# Patient Record
Sex: Female | Born: 1980 | Race: White | Hispanic: No | Marital: Married | Smoking: Current every day smoker
Health system: Southern US, Community
[De-identification: ages and names within clinical notes are randomized; demographics above are authoritative.]

## PROBLEM LIST (undated history)

## (undated) ENCOUNTER — Inpatient Hospital Stay (HOSPITAL_COMMUNITY): Payer: Self-pay

## (undated) DIAGNOSIS — G43909 Migraine, unspecified, not intractable, without status migrainosus: Secondary | ICD-10-CM

## (undated) DIAGNOSIS — R569 Unspecified convulsions: Secondary | ICD-10-CM

## (undated) DIAGNOSIS — N200 Calculus of kidney: Secondary | ICD-10-CM

## (undated) DIAGNOSIS — M549 Dorsalgia, unspecified: Secondary | ICD-10-CM

## (undated) DIAGNOSIS — D649 Anemia, unspecified: Secondary | ICD-10-CM

## (undated) DIAGNOSIS — O24419 Gestational diabetes mellitus in pregnancy, unspecified control: Secondary | ICD-10-CM

## (undated) DIAGNOSIS — O139 Gestational [pregnancy-induced] hypertension without significant proteinuria, unspecified trimester: Secondary | ICD-10-CM

## (undated) DIAGNOSIS — F419 Anxiety disorder, unspecified: Secondary | ICD-10-CM

## (undated) DIAGNOSIS — N83209 Unspecified ovarian cyst, unspecified side: Secondary | ICD-10-CM

## (undated) HISTORY — PX: OVARIAN CYST REMOVAL: SHX89

---
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
## 2002-05-25 ENCOUNTER — Encounter: Payer: Self-pay | Admitting: Obstetrics & Gynecology

## 2002-05-25 ENCOUNTER — Ambulatory Visit (HOSPITAL_COMMUNITY): Admission: RE | Admit: 2002-05-25 | Discharge: 2002-05-25 | Payer: Self-pay | Admitting: Obstetrics & Gynecology

## 2003-01-04 ENCOUNTER — Emergency Department (HOSPITAL_COMMUNITY): Admission: EM | Admit: 2003-01-04 | Discharge: 2003-01-04 | Payer: Self-pay | Admitting: Emergency Medicine

## 2003-01-04 ENCOUNTER — Encounter: Payer: Self-pay | Admitting: Emergency Medicine

## 2003-01-08 ENCOUNTER — Emergency Department (HOSPITAL_COMMUNITY): Admission: EM | Admit: 2003-01-08 | Discharge: 2003-01-09 | Payer: Self-pay | Admitting: Internal Medicine

## 2003-01-08 ENCOUNTER — Encounter: Payer: Self-pay | Admitting: *Deleted

## 2004-08-30 ENCOUNTER — Other Ambulatory Visit: Admission: RE | Admit: 2004-08-30 | Discharge: 2004-08-30 | Payer: Self-pay | Admitting: Family Medicine

## 2005-03-10 ENCOUNTER — Ambulatory Visit (HOSPITAL_COMMUNITY): Admission: RE | Admit: 2005-03-10 | Discharge: 2005-03-10 | Payer: Self-pay | Admitting: Family Medicine

## 2005-06-08 ENCOUNTER — Emergency Department (HOSPITAL_COMMUNITY): Admission: EM | Admit: 2005-06-08 | Discharge: 2005-06-09 | Payer: Self-pay | Admitting: Emergency Medicine

## 2005-09-23 ENCOUNTER — Other Ambulatory Visit: Admission: RE | Admit: 2005-09-23 | Discharge: 2005-09-23 | Payer: Self-pay | Admitting: Family Medicine

## 2005-10-14 ENCOUNTER — Emergency Department (HOSPITAL_COMMUNITY): Admission: EM | Admit: 2005-10-14 | Discharge: 2005-10-15 | Payer: Self-pay | Admitting: Emergency Medicine

## 2005-10-16 ENCOUNTER — Ambulatory Visit (HOSPITAL_COMMUNITY): Admission: RE | Admit: 2005-10-16 | Discharge: 2005-10-16 | Payer: Self-pay | Admitting: Emergency Medicine

## 2005-12-06 ENCOUNTER — Emergency Department (HOSPITAL_COMMUNITY): Admission: EM | Admit: 2005-12-06 | Discharge: 2005-12-06 | Payer: Self-pay | Admitting: Emergency Medicine

## 2006-08-07 ENCOUNTER — Ambulatory Visit (HOSPITAL_COMMUNITY): Admission: RE | Admit: 2006-08-07 | Discharge: 2006-08-07 | Payer: Self-pay | Admitting: Family Medicine

## 2006-08-29 ENCOUNTER — Emergency Department (HOSPITAL_COMMUNITY): Admission: EM | Admit: 2006-08-29 | Discharge: 2006-08-29 | Payer: Self-pay | Admitting: Emergency Medicine

## 2006-09-10 ENCOUNTER — Ambulatory Visit (HOSPITAL_COMMUNITY): Admission: RE | Admit: 2006-09-10 | Discharge: 2006-09-10 | Payer: Self-pay | Admitting: Orthopedic Surgery

## 2006-11-07 ENCOUNTER — Emergency Department (HOSPITAL_COMMUNITY): Admission: EM | Admit: 2006-11-07 | Discharge: 2006-11-07 | Payer: Self-pay | Admitting: Emergency Medicine

## 2006-11-14 ENCOUNTER — Emergency Department (HOSPITAL_COMMUNITY): Admission: EM | Admit: 2006-11-14 | Discharge: 2006-11-14 | Payer: Self-pay | Admitting: Emergency Medicine

## 2007-01-22 ENCOUNTER — Emergency Department (HOSPITAL_COMMUNITY): Admission: EM | Admit: 2007-01-22 | Discharge: 2007-01-23 | Payer: Self-pay | Admitting: Emergency Medicine

## 2007-01-23 ENCOUNTER — Ambulatory Visit (HOSPITAL_COMMUNITY): Admission: RE | Admit: 2007-01-23 | Discharge: 2007-01-23 | Payer: Self-pay | Admitting: Obstetrics and Gynecology

## 2007-06-07 ENCOUNTER — Observation Stay (HOSPITAL_COMMUNITY): Admission: AD | Admit: 2007-06-07 | Discharge: 2007-06-07 | Payer: Self-pay | Admitting: Obstetrics & Gynecology

## 2007-07-08 ENCOUNTER — Inpatient Hospital Stay (HOSPITAL_COMMUNITY): Admission: AD | Admit: 2007-07-08 | Discharge: 2007-07-10 | Payer: Self-pay | Admitting: Obstetrics & Gynecology

## 2007-07-08 ENCOUNTER — Ambulatory Visit: Payer: Self-pay | Admitting: Obstetrics & Gynecology

## 2007-12-07 IMAGING — US US TRANSVAGINAL NON-OB
1 series · 14 of 25 positions shown · non-contrast
Comparison: 10/06/05.

CLINICAL DATA: Pelvic pain and history of ovarian cysts.  
 TRANSABDOMINAL AND TRANSVAGINAL PELVIC ULTRASOUND:
TECHNIQUE: Both transabdominal and transvaginal ultrasound examinations of the pelvis were performed including evaluation of the uterus, ovaries, adnexal regions, and pelvic cul-de-sac.

[Series 1: unknown · 0.32mm/px · 14 of 52 slices shown]
[im 1/52]
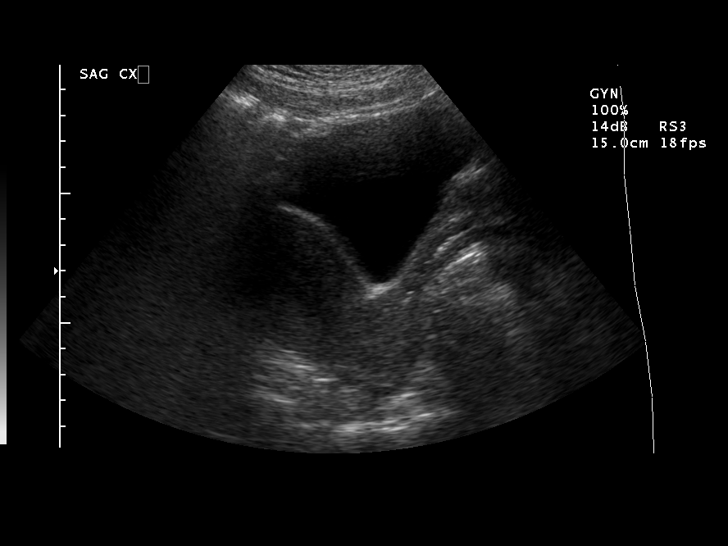
[im 5/52]
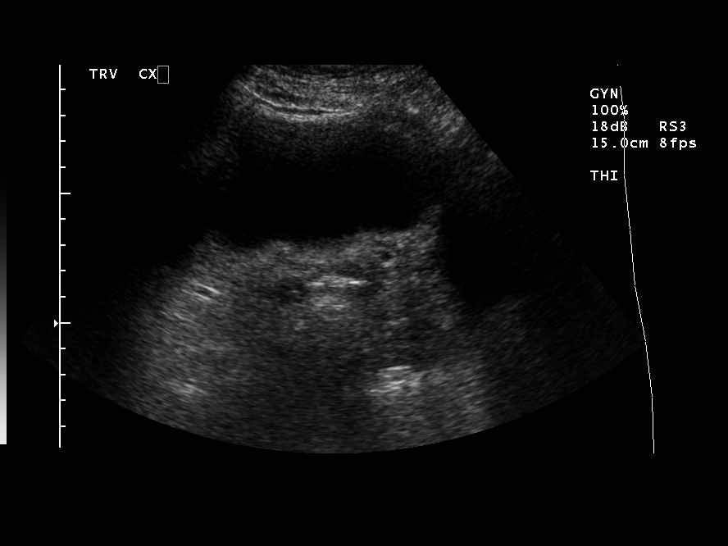
[im 9/52]
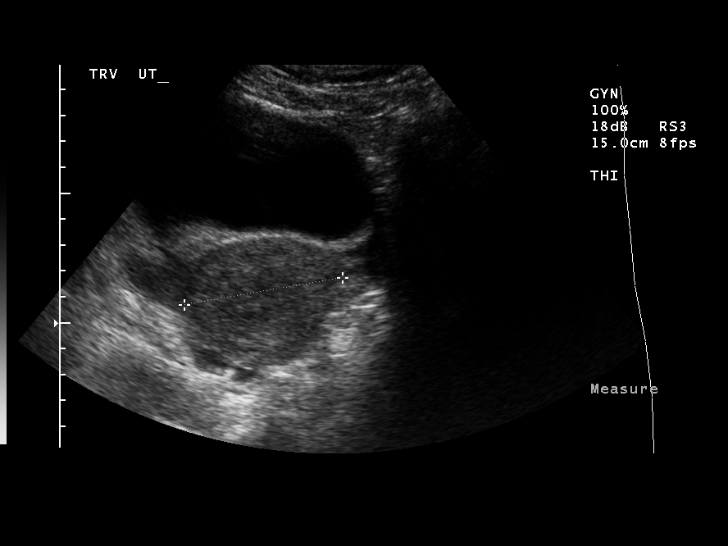
[im 13/52]
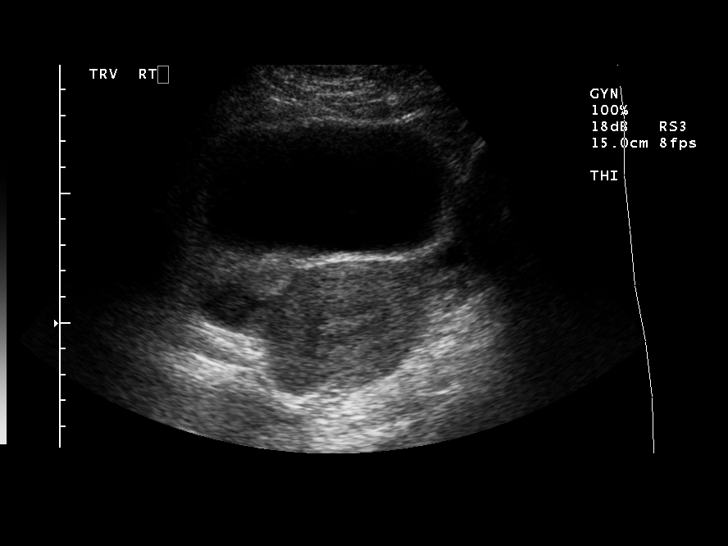
[im 18/52]
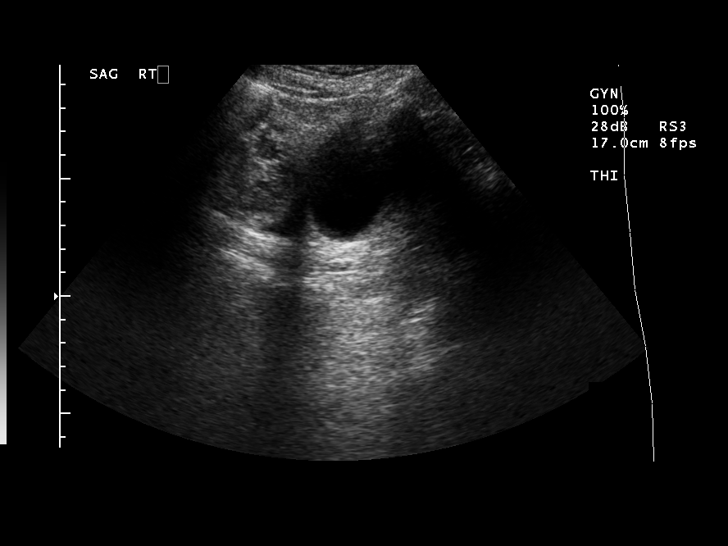
[im 20/52]
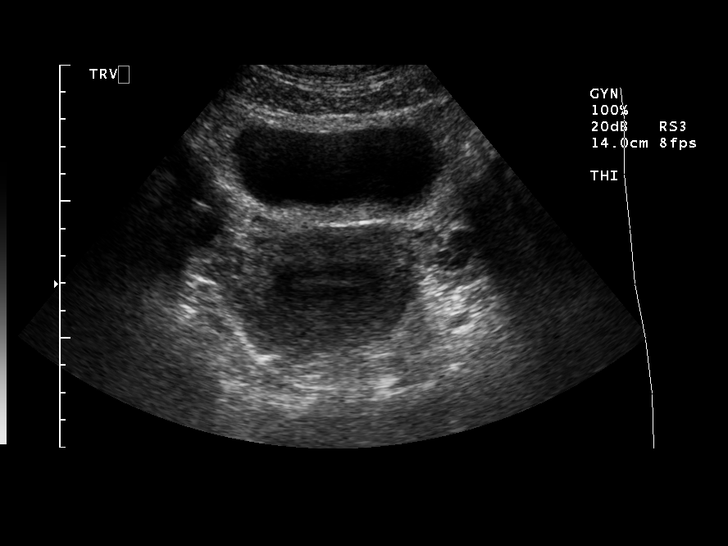
[im 24/52]
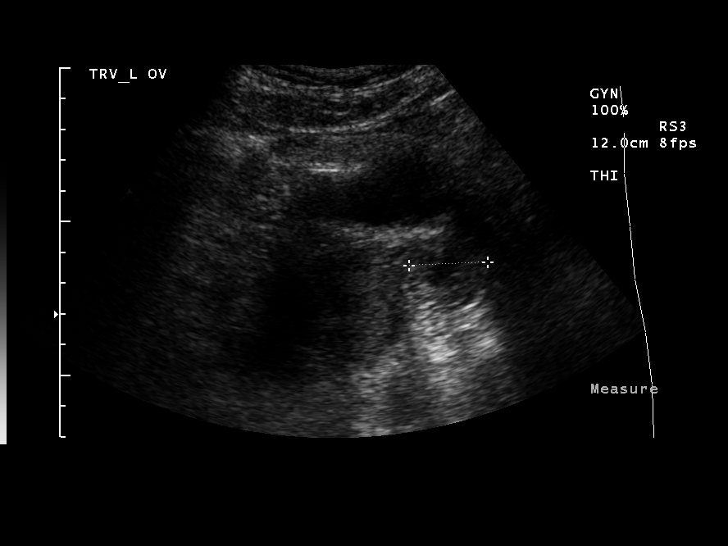
[im 28/52]
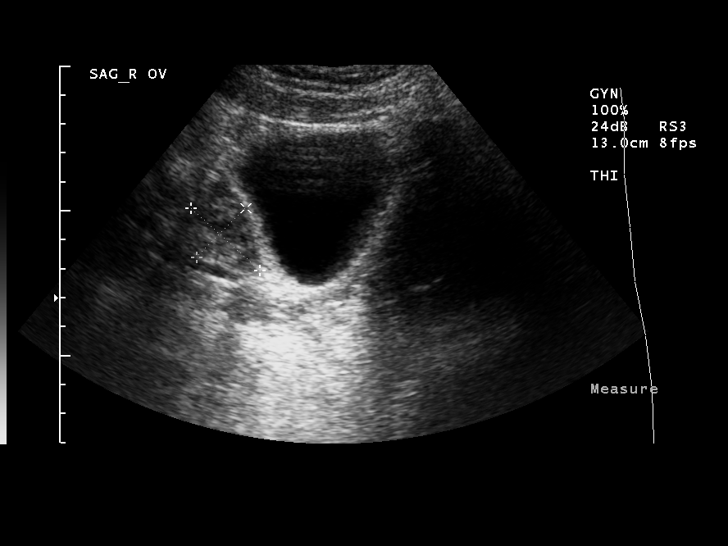
[im 32/52]
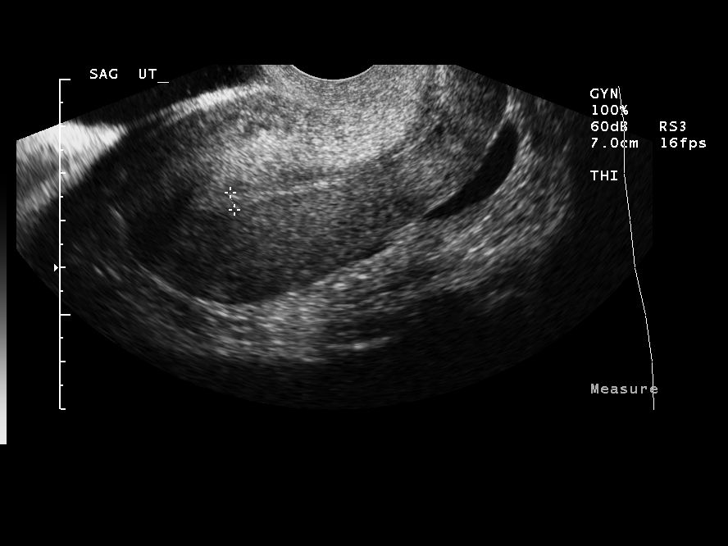
[im 35/52]
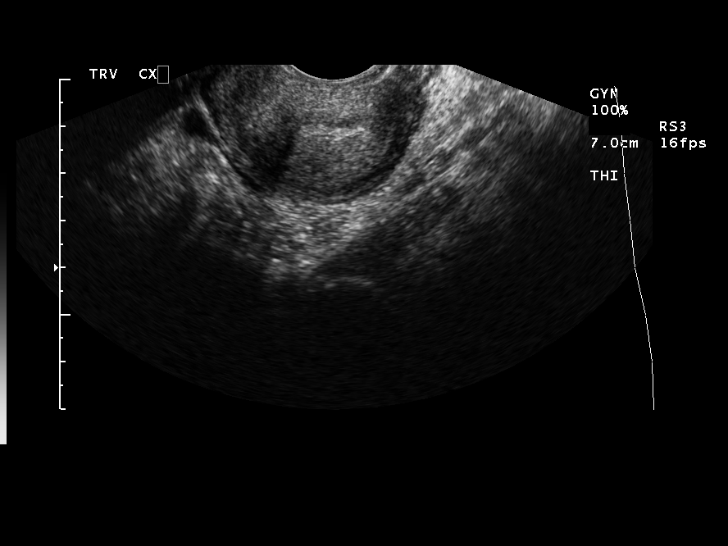
[im 39/52]
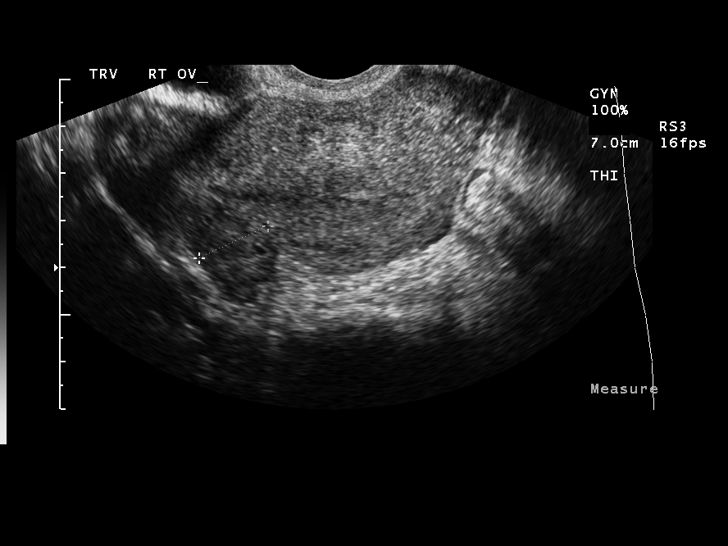
[im 43/52]
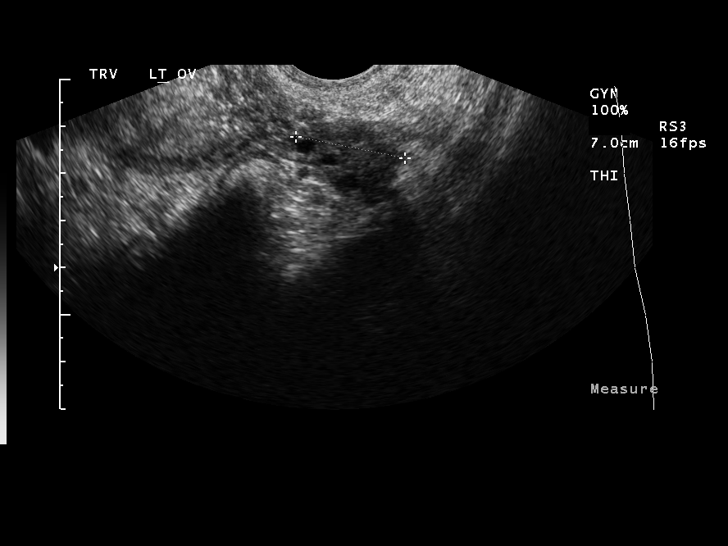
[im 47/52]
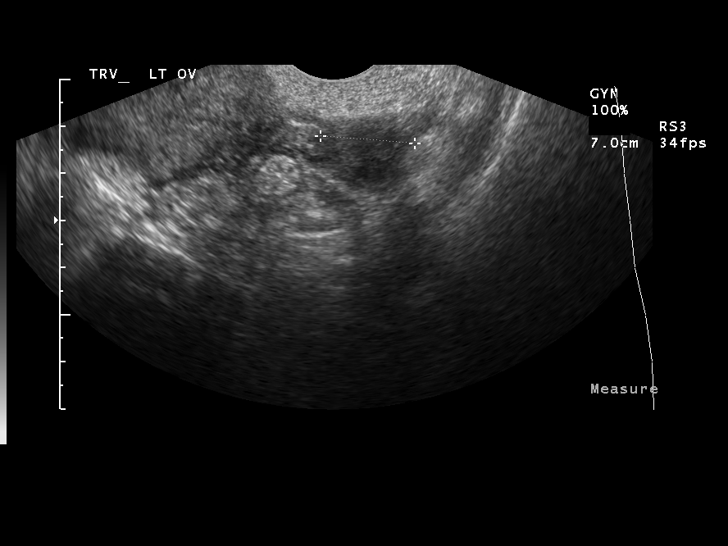
[im 52/52]
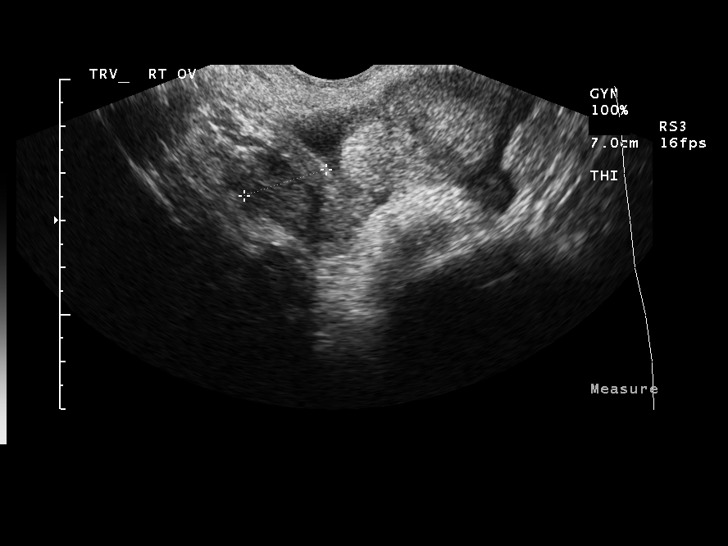

[14 of 25 positions shown; findings below may reference images not displayed]

FINDINGS: Both transabdominal and transvaginal studies were performed.  Uterine dimensions are approximately 7.2 x 4.4 x 6.2 cm.  The endometrium is of normal thickness measuring 4 mm.  Uterus has a normal appearance. 
 The right ovary is normal and measures 2.4 x 1.9 x 1.8 cm.  The left ovary measures 2.3 x 1.4 x 2.5 cm.  There is a focal cyst present in the left ovary measuring 1.7 cm in greatest diameter.  By transvaginal study, this cyst is internally complex and likely consistent with a hemorrhagic cyst.  A small amount of free fluid is present in the pelvis.
IMPRESSION: Small and likely hemorrhagic cyst measuring 1.7 cm in greatest diameter within the left ovary.  Small amount of pelvic fluid.

## 2008-03-18 ENCOUNTER — Emergency Department (HOSPITAL_COMMUNITY): Admission: EM | Admit: 2008-03-18 | Discharge: 2008-03-18 | Payer: Self-pay | Admitting: Emergency Medicine

## 2008-12-02 ENCOUNTER — Emergency Department (HOSPITAL_COMMUNITY): Admission: EM | Admit: 2008-12-02 | Discharge: 2008-12-02 | Payer: Self-pay | Admitting: Emergency Medicine

## 2009-01-24 ENCOUNTER — Encounter: Admission: RE | Admit: 2009-01-24 | Discharge: 2009-01-24 | Payer: Self-pay | Admitting: Neurology

## 2009-02-05 ENCOUNTER — Encounter: Admission: RE | Admit: 2009-02-05 | Discharge: 2009-04-04 | Payer: Self-pay | Admitting: Family Medicine

## 2009-03-30 ENCOUNTER — Encounter: Admission: RE | Admit: 2009-03-30 | Discharge: 2009-03-30 | Payer: Self-pay | Admitting: Neurosurgery

## 2009-05-17 ENCOUNTER — Encounter: Admission: RE | Admit: 2009-05-17 | Discharge: 2009-06-05 | Payer: Self-pay | Admitting: Anesthesiology

## 2009-05-22 ENCOUNTER — Ambulatory Visit: Payer: Self-pay | Admitting: Anesthesiology

## 2009-06-05 ENCOUNTER — Ambulatory Visit: Payer: Self-pay | Admitting: Anesthesiology

## 2009-10-12 ENCOUNTER — Ambulatory Visit: Payer: Self-pay | Admitting: Diagnostic Radiology

## 2009-10-12 ENCOUNTER — Emergency Department (HOSPITAL_BASED_OUTPATIENT_CLINIC_OR_DEPARTMENT_OTHER): Admission: EM | Admit: 2009-10-12 | Discharge: 2009-10-12 | Payer: Self-pay | Admitting: Emergency Medicine

## 2009-12-04 ENCOUNTER — Ambulatory Visit: Payer: Self-pay | Admitting: Diagnostic Radiology

## 2009-12-04 ENCOUNTER — Emergency Department (HOSPITAL_BASED_OUTPATIENT_CLINIC_OR_DEPARTMENT_OTHER): Admission: EM | Admit: 2009-12-04 | Discharge: 2009-12-04 | Payer: Self-pay | Admitting: Emergency Medicine

## 2009-12-24 ENCOUNTER — Emergency Department (HOSPITAL_COMMUNITY): Admission: EM | Admit: 2009-12-24 | Discharge: 2009-12-24 | Payer: Self-pay | Admitting: Emergency Medicine

## 2011-03-04 ENCOUNTER — Emergency Department (HOSPITAL_COMMUNITY): Payer: No Typology Code available for payment source

## 2011-03-04 ENCOUNTER — Emergency Department (HOSPITAL_COMMUNITY)
Admission: EM | Admit: 2011-03-04 | Discharge: 2011-03-04 | Disposition: A | Discharge status: Home / Self Care | Payer: No Typology Code available for payment source | Attending: General Surgery | Admitting: General Surgery

## 2011-03-04 DIAGNOSIS — M542 Cervicalgia: Secondary | ICD-10-CM | POA: Insufficient documentation

## 2011-03-04 DIAGNOSIS — M545 Low back pain, unspecified: Secondary | ICD-10-CM | POA: Insufficient documentation

## 2011-03-04 DIAGNOSIS — R072 Precordial pain: Secondary | ICD-10-CM | POA: Insufficient documentation

## 2011-03-09 LAB — URINALYSIS, ROUTINE W REFLEX MICROSCOPIC: pH: 6.5 (ref 5.0–8.0)

## 2011-03-09 LAB — URINE MICROSCOPIC-ADD ON

## 2011-03-09 LAB — DIFFERENTIAL
Eosinophils Relative: 2 % (ref 0–5)
Lymphocytes Relative: 44 % (ref 12–46)
Lymphs Abs: 4 10*3/uL (ref 0.7–4.0)
Monocytes Absolute: 0.7 10*3/uL (ref 0.1–1.0)
Monocytes Relative: 8 % (ref 3–12)

## 2011-03-09 LAB — CBC
HCT: 40.7 % (ref 36.0–46.0)
MCV: 93.3 fL (ref 78.0–100.0)
RBC: 4.36 MIL/uL (ref 3.87–5.11)

## 2011-03-09 LAB — BASIC METABOLIC PANEL
BUN: 12 mg/dL (ref 6–23)
Calcium: 9.5 mg/dL (ref 8.4–10.5)
Creatinine, Ser: 0.63 mg/dL (ref 0.4–1.2)
Potassium: 3.5 mEq/L (ref 3.5–5.1)
Sodium: 139 mEq/L (ref 135–145)

## 2011-03-09 LAB — URINE CULTURE: Culture: NO GROWTH

## 2011-03-19 ENCOUNTER — Other Ambulatory Visit (HOSPITAL_COMMUNITY): Payer: Self-pay | Admitting: Sports Medicine

## 2011-03-19 DIAGNOSIS — R112 Nausea with vomiting, unspecified: Secondary | ICD-10-CM

## 2011-03-21 ENCOUNTER — Other Ambulatory Visit (HOSPITAL_COMMUNITY): Payer: Self-pay | Admitting: Sports Medicine

## 2011-03-21 ENCOUNTER — Ambulatory Visit (HOSPITAL_COMMUNITY)
Admission: RE | Admit: 2011-03-21 | Discharge: 2011-03-21 | Disposition: A | Discharge status: Home / Self Care | Payer: No Typology Code available for payment source | Source: Ambulatory Visit | Attending: Sports Medicine | Admitting: Sports Medicine

## 2011-03-21 DIAGNOSIS — R112 Nausea with vomiting, unspecified: Secondary | ICD-10-CM

## 2011-03-21 DIAGNOSIS — R1032 Left lower quadrant pain: Secondary | ICD-10-CM | POA: Insufficient documentation

## 2011-03-21 MED ORDER — IOHEXOL 300 MG/ML  SOLN
80.0000 mL | Freq: Once | INTRAMUSCULAR | Status: AC | PRN
  Administered 2011-03-21: 80 mL via INTRAVENOUS

## 2011-03-25 LAB — PREGNANCY, URINE: Preg Test, Ur: NEGATIVE

## 2011-03-27 LAB — URINALYSIS, ROUTINE W REFLEX MICROSCOPIC
Glucose, UA: NEGATIVE mg/dL
Ketones, ur: NEGATIVE mg/dL
Protein, ur: NEGATIVE mg/dL

## 2011-03-27 LAB — URINE MICROSCOPIC-ADD ON

## 2011-05-06 NOTE — Procedures (Signed)
NAME:  Cassandra Roth, Cassandra Roth NO.:  [id number]   MEDICAL RECORD NO.:  [id number]           PATIENT TYPE:   LOCATION:                                 FACILITY:   PHYSICIAN:  Celene Kras, MD        DATE OF BIRTH:  1981/11/26   DATE OF PROCEDURE:  DATE OF DISCHARGE:                               OPERATIVE REPORT   [facility].  I evaluated  him.  I have reviewed the health and history form.  I reviewed the 47-  point review of systems.   1. Cassandra Roth comes to [place] today for scheduled transforaminal intervention,      left side; left side most problematic, some right-sided pain, but a      radicular element seem to be pronounced on left.  2. No interval change.  Modifiable patient's health profile reviewed.   I have used models and discussed in lay terms ample times and as  requested.   Objectively diffuse paralumbar myofascial discomfort, Fortin test  positive, side bending, straight leg lift impaired, nothing new  neurologically.   IMPRESSION:  She has spondylolysis of the lumbar spine, radicular  symptoms.   PLAN:  Transforaminal epidural intervention, left L5 with needle  technique under local anesthetic.  She is consented.   The patient  was taken to the fluoroscopy suite and was placed on prone  position.  The back prepped and draped in the usual fashion.  Using a 22-  gauge spinal needle, I advanced this up to the transforaminal epidural  space, confirmed placement.  No CSF, heme, or paresthesia.  Reconfirmation placement multiple position, contrast.  Once contrast  confirmation, withdrawn needle technique, 2 mL of lidocaine 1% MPF with  40 mg of Aristocort.   Tolerated procedure well.  No complication from our procedure.  Appropriate recovery.  Discharge instructions given.           ______________________________  Celene Kras, MD     HH/MEDQ  D:  06/05/2009 10:57:35  T:  06/06/2009 01:02:29  Job:  [id number]

## 2011-05-06 NOTE — Assessment & Plan Note (Signed)
Cassandra Roth comes to the pain management through Dr. Cassandria Santee office.  She is a 30 year old who at one time has been contemplated for surgical  correction of L5-S1 retrolisthesis, diskogenic pathology.  Complaining  of left radicular pain consistent with 4 and 5, she states she is taking  oxycodone q.6-8 h. but many times has to take q.4 h.  She is recently  started on diazepam.  She has no known drug allergies.  8/10 subjective  scale, functional impairment made worse by walking, bending, sitting,  and standing.  Improved with rest and medications.  She is working now  as a Agricultural engineer, and there is apparently some lifting involved  which aggravates her condition.  Assisted at home, she works 40 hours,  needs to keep continue to work as she has young children.  14-point  review of systems, PMH remarkable for MRI diskogenic pathology at 5-1,  she has had what sounds like a singular midline epidural with mixed  results.   She has past medical history of seizures unspecified.  She had  laparoscopic surgery, unspecified followed by primary care.  She is a  smoker and has been counseled married, denies chemical and alcohol  dependency.   FAMILY HISTORY:  Diabetes, hypertension.   Review of systems, family, and social history are otherwise  noncontributory for pain problems.   PHYSICAL EXAMINATION:  GENERAL:  Pleasant female sitting comfortably in  bed.  Gait, affect, and appearance is normal and oriented x3.  HEENT:  Otherwise unremarkable.  CHEST:  Clear to auscultation and percussion.  HEART:  She has regular rate and rhythm without rub, murmur, or gallop.  ABDOMEN:  Soft, nontender, benign.  No hepatosplenomegaly.  MUSCULOSKELETAL:  She has diffuse paralumbar myofascial discomfort.  Pain with extension and side bending and Fortin test equivocal.  Modified Gaenslen's and Patrick's equivocal.  Pain with extension and  side bending.  Straight leg lift unremarkable, she does have  some points  to the left, full extension, EHL intact as is neurological assessment.   IMPRESSION:  Degenerative spine disease, lumbar spine, radicular  symptoms.   PLAN:  1. Recommend transforaminal approach for dense application of drugs at      the most probable cytopathology L5.  Consider facet intervention      with RF as an option.  This may be one of our best nonsurgical      approaches in the young individual, vital member of society.  She      wants to continue work.  2. She has been told in order to help her with some insight here that      cigarette cessation is mandatory for best outcome.  3. We will do appropriate backgrounds and I am going to start her on      Neurontin, she has Percocet, consider adding other adjuncts.  The      Neurontin will be utilized at bedtime.  I have reviewed this      medication with her.   Discharge instructions given.  No barrier to communication.  We will see  her in followup.           ______________________________  Celene Kras, MD     HH/MedQ  D:  05/22/2009 12:27:06  T:  05/23/2009 02:52:18  Job #:  [id number]   cc:   Hilda Lias, M.D.  Fax: [phone]

## 2011-05-06 NOTE — H&P (Signed)
NAME:  Cassandra Roth, Cassandra Roth                  ACCOUNT NO.:  [id number]   MEDICAL RECORD NO.:  [id number]          PATIENT TYPE:  INP   LOCATION:  9175                          FACILITY:  [facility]   PHYSICIAN:  Tilda Burrow, M.D. DATE OF BIRTH:  01/19/1981   DATE OF ADMISSION:  07/08/2007  DATE OF DISCHARGE:                              HISTORY & PHYSICAL   REASON FOR ADMISSION:  Pregnancy at 38-39 weeks, in active labor.   [place] was admitted through [facility]U in active labor.  Patient was 8-9 cm,  completely effaced, and 0 station.  At the time, she was mentioning  complaints of severe back pain.  She progressed rapidly after epidural  anesthesia and delivered spontaneously.  From delivery of head, the  infant was thoroughly suctioned.  There was a short shoulder cord noted,  which was easily slipped over and reduced.  Spontaneous delivery of  infant without complications.  Apgars were 8 and 9.  The perineum was  inspected and noted to be intact.  Cord bloods were obtained.  The  placenta was delivered spontaneously via Schultze mechanism.  A three-  vessel cord was noted to be intact, noted on inspection, and membranes  are noted to be intact upon inspection.  __________ about 500 cc.   Infant and mother stabilized and transferred out to the postpartum unit  in stable condition.   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      Tilda Burrow, M.D.  Electronically Signed    DL/MEDQ  D:  04/54/0981  T:  07/08/2007  Job:  [id number]

## 2011-05-09 NOTE — Op Note (Signed)
[facility]  Patient:    Cassandra Roth, Cassandra Roth                       MRN: [id number] Proc. Date: 05/29/00 Adm. Date:  [id number] Disc. Date: [id number] Attending:  Donne Hazel                           Operative Report  PREOPERATIVE DIAGNOSES:       1. Pelvic pain.                               2. Rule out endometriosis.  POSTOPERATIVE DIAGNOSIS:      Normal pelvis.  OPERATION:                    Laparoscopy  SURGEON:                      R. Alan Mulder, M.D.  ASSISTANT:  ANESTHESIA:                   General endotracheal  ESTIMATED BLOOD LOSS:         Less than 20 cc  COMPLICATIONS:                None.  FINDINGS:                     Normal pelvis and abdomen. At the time of laparoscopy, the pelvis was thoroughly visualized and noted to be normal. There was a small cyst of the left ovary which was removed; however, this was normal for a patient this age.  The adnexae, cul-de-sac and uterus were very normal.  The appendix was visualized and noted to be normal. The bowel and upper abdomen visualized and also normal.  INDICATIONS:  DESCRIPTION OF PROCEDURE:     The patient was taken to the operating room where a general endotracheal anesthesia was instituted.  The patient was placed on the operating table in the dorsal lithotomy position. The abdomen, perineum and vagina was prepped and draped in the usual sterile fashion with Betadine and sterile drapes.  A red rubber catheter was used to empty the bladder.  A Hulka tenaculum was placed inside the uterus for uterine manipulation.  Next, a small vertical infraumbilical skin incision was made through which a Verres needle was inserted atraumatically into the abdominal cavity.  A pneumoperitoneum was created with 1 liters of carbon dioxide gas. The Verres needle was removed and a disposable laparoscopic trocar was inserted atraumatically into the abdominal cavity.  The laparoscope was  then inserted with the above noted findings.  The corpus luteum on the left side was drained using the bipolar cautery.   This was extremely normal for a patient this age as mentioned.  A second incision was made two fingerbreadths above the pubic symphysis and in the midline.  A 5 mm trocar was placed under direct, continuous laparoscopic guidance.  This was done prior to draining of the little cyst.  The pelvis, appendix, bowel and upper abdominal were visualized and all noted to be normal. The pelvis was then irrigated with irrigant. No excessive bleeding noted. Attention was then turned to closure. All abdominal instruments were removed and the gas allowed to escape fully from the abdomen.  The umbilical skin incision was closed first with 0  Vicryl on the muscle fascia times three interrupted sutures.  The skin was reapproximated with Dermabond on both incisions. All vaginal instruments were removed.  The patient was awakened, extubated and taken to the recovery room in good condition.  There were no preoperative complications. DD:  05/29/00 TD:  06/01/00 Job: [id number]

## 2011-05-09 NOTE — H&P (Signed)
[facility]  Patient:    Cassandra Roth, Cassandra Roth                       MRN: [id number] Adm. Date:  [id number] Attending:  Donne Hazel                         History and Physical  HISTORY OF PRESENT ILLNESS:       Ms. Cassandra Roth is a 30 year old female, gravida 1, para 1, admitted to laparoscopy due to primary pelvic pain.  The patient has been on oral contraception and has received nonsteroidal anti-inflammatory agents and mild narcotic agents that failed to improve her pain.  She continues to have worsening pelvic pain and requests laparoscopy.  She is otherwise healthy.  She is a smoker, however.  PAST MEDICAL HISTORY:             History of asthma.  OBSTETRICAL HISTORY:              Normal spontaneous vaginal delivery x 1 at term.  PAST SURGICAL HISTORY:            None.  CURRENT MEDICATIONS:              Ortho Tri-Cyclen.  ALLERGIES:                        None known.  SOCIAL HISTORY:                   Positive for one pack per day of cigarettes.  PHYSICAL EXAMINATION:  VITAL SIGNS:                      Stable, blood pressure 127/77, temperature 97.6, pulse 82, respirations 18.  GENERAL:                          Well-developed, well-nourished female in no acute distress.  HEENT:                            Within normal limits.  NECK:                             Supple without adenopathy or thyromegaly.  HEART:                            Regular rate and rhythm without murmur, gallop, or rub.  LUNGS:                            Clear to auscultation.  BREASTS:                          Exam done recently in the office was normal but is deferred upon admission.  ABDOMEN:                          Soft, benign, and unremarkable.  EXTREMITIES:                      Grossly normal.  NEUROLOGIC:  Grossly normal.  PELVIC:                           Normal external female genitalia, vagina and cervix clear.  The uterus is  small and mildly tender and mobile.  The adnexa is clear of mass and mildly tender bilaterally.  No focal pathology palpated.  LABORATORY DATA:                  Ultrasound recently has been normal.  ADMISSION DIAGNOSES:              1. Pelvic pain of unknown etiology.                                   2. Rule out endometriosis.  PLAN:                             Laparoscopy.  DISCUSSION:                       Risks and benefits of surgery explained to patient.  The risk of bleeding, infection, risk of injury to surrounding organs reiterated.  The patient was allowed to ask questions and wished to proceed. DD:  05/29/00 TD:  05/29/00 Job: [id number]

## 2011-08-05 ENCOUNTER — Encounter: Payer: Self-pay | Admitting: *Deleted

## 2011-08-05 ENCOUNTER — Emergency Department (INDEPENDENT_AMBULATORY_CARE_PROVIDER_SITE_OTHER): Payer: Self-pay

## 2011-08-05 ENCOUNTER — Emergency Department (HOSPITAL_BASED_OUTPATIENT_CLINIC_OR_DEPARTMENT_OTHER)
Admission: EM | Admit: 2011-08-05 | Discharge: 2011-08-05 | Disposition: A | Discharge status: Home / Self Care | Payer: Self-pay | Attending: Emergency Medicine | Admitting: Emergency Medicine

## 2011-08-05 DIAGNOSIS — R404 Transient alteration of awareness: Secondary | ICD-10-CM

## 2011-08-05 DIAGNOSIS — X58XXXA Exposure to other specified factors, initial encounter: Secondary | ICD-10-CM

## 2011-08-05 DIAGNOSIS — S1093XA Contusion of unspecified part of neck, initial encounter: Secondary | ICD-10-CM

## 2011-08-05 DIAGNOSIS — R1110 Vomiting, unspecified: Secondary | ICD-10-CM

## 2011-08-05 DIAGNOSIS — W010XXA Fall on same level from slipping, tripping and stumbling without subsequent striking against object, initial encounter: Secondary | ICD-10-CM | POA: Insufficient documentation

## 2011-08-05 DIAGNOSIS — S060XAA Concussion with loss of consciousness status unknown, initial encounter: Secondary | ICD-10-CM | POA: Insufficient documentation

## 2011-08-05 DIAGNOSIS — Y92009 Unspecified place in unspecified non-institutional (private) residence as the place of occurrence of the external cause: Secondary | ICD-10-CM | POA: Insufficient documentation

## 2011-08-05 DIAGNOSIS — S060X9A Concussion with loss of consciousness of unspecified duration, initial encounter: Secondary | ICD-10-CM

## 2011-08-05 HISTORY — DX: Anxiety disorder, unspecified: F41.9

## 2011-08-05 HISTORY — DX: Unspecified ovarian cyst, unspecified side: N83.209

## 2011-08-05 HISTORY — DX: Dorsalgia, unspecified: M54.9

## 2011-08-05 MED ORDER — MORPHINE SULFATE 2 MG/ML IJ SOLN
2.0000 mg | Freq: Once | INTRAMUSCULAR | Status: AC
  Administered 2011-08-05: 2 mg via INTRAVENOUS
  Filled 2011-08-05: qty 1

## 2011-08-05 MED ORDER — HYDROCODONE-ACETAMINOPHEN 5-500 MG PO TABS: 1.0000 | ORAL_TABLET | Freq: Four times a day (QID) | ORAL | Status: DC | PRN

## 2011-08-05 MED ORDER — ONDANSETRON HCL 4 MG/2ML IJ SOLN
4.0000 mg | Freq: Once | INTRAMUSCULAR | Status: AC
  Administered 2011-08-05: 4 mg via INTRAVENOUS
  Filled 2011-08-05: qty 2

## 2011-08-05 MED ORDER — IBUPROFEN 600 MG PO TABS: 600.0000 mg | ORAL_TABLET | Freq: Four times a day (QID) | ORAL | Status: AC | PRN

## 2011-08-05 MED ORDER — HYDROCODONE-ACETAMINOPHEN 5-500 MG PO TABS: 1.0000 | ORAL_TABLET | Freq: Four times a day (QID) | ORAL | Status: AC | PRN

## 2011-08-05 MED ORDER — SODIUM CHLORIDE 0.9 % IV BOLUS (SEPSIS)
1000.0000 mL | Freq: Once | INTRAVENOUS | Status: AC
  Administered 2011-08-05: 1000 mL via INTRAVENOUS

## 2011-08-05 NOTE — ED Notes (Signed)
MD at bedside. Pt assessed. Ice pack given, pt tolerated well.

## 2011-08-05 NOTE — ED Notes (Signed)
Pt fell hitting side of head on bath tub, unknown LOC. Pt has swelling to outer left eye area and left side of head, no bleeding noted.

## 2011-08-05 NOTE — ED Notes (Signed)
Pt slipped in floor and fell hitting head on side of tub. Swelling noted to left side of face and outer eye, unknown LOC.

## 2011-08-05 NOTE — ED Provider Notes (Signed)
History     CSN: [id number] Arrival date & time: 08/05/2011  4:58 AM  Chief Complaint  Patient presents with  . Head Injury   HPI Comments: 30yoF previously healthy pw BHT. Per patient she slipped on something wet in the bathroom and felt herself falling. Daughter heard her fall and rushed into the bathroom- saw pt unconscious on the floor ? approx one minute. Pt eventually awake and a little confused. Reports multiple episodes of NBNB emesis. Continues to feel nauseated. Complaining of pain to left head. Also c/o b/l blurry vision. Denies neck pain/back pain/abdominal pain. Denies UTI sx. She does c/o lightheadedness and headache since the fall. Denies numbness/tingling/weakness of her extremities  Patient is a 30 y.o. female presenting with head injury.  Head Injury     Past Medical History  Diagnosis Date  . Back pain   . Anxiety   . Ovarian cyst     History reviewed. No pertinent past surgical history.  History reviewed. No pertinent family history.  History  Substance Use Topics  . Smoking status: Current Everyday Smoker  . Smokeless tobacco: Not on file  . Alcohol Use: No    OB History    Grav Para Term Preterm Abortions TAB SAB Ect Mult Living                  Review of Systems  All other systems reviewed and are negative.  except as noted HPI   Physical Exam  BP 120/79  Pulse 91  Temp(Src) 97.5 F (36.4 C) (Oral)  Resp 20  SpO2 100%  Physical Exam  Nursing note and vitals reviewed. Constitutional: She is oriented to person, place, and time. She appears well-developed.       Crying, appears to be uncomfortable  HENT:  Nose: Nose normal.  Mouth/Throat: Oropharynx is clear and moist.       L lateral upper orbit + ecchymosis and swelling +overlying ttp  No clicks/clunks TMJ  Eyes: Conjunctivae and EOM are normal. Pupils are equal, round, and reactive to light.  Neck: Normal range of motion. Neck supple.       No c/t/l/s ttp  Cardiovascular: Normal  rate, regular rhythm, normal heart sounds and intact distal pulses.   Pulmonary/Chest: Effort normal and breath sounds normal. No respiratory distress. She has no wheezes. She has no rales.  Abdominal: Soft. She exhibits no distension. There is no tenderness. There is no rebound and no guarding.  Musculoskeletal: Normal range of motion.  Neurological: She is alert and oriented to person, place, and time. No cranial nerve deficit. She exhibits normal muscle tone. Coordination normal.  Skin: Skin is warm and dry. No rash noted.  Psychiatric: She has a normal mood and affect.    ED Course  Procedures  MDM 30yoFs/p mechanical fall with BHT/LOC/vomiting. Will check CT head. Pain control, zofran. IVF. Reassess. Anticipate discharge home if CT head negative    6:33 AM  CT head and maxillofacial negative. Will discharge home with brain injury/concussion precautions. To go home with her family  Forbes Cellar, MD 08/05/11 [phone]

## 2011-09-26 LAB — BASIC METABOLIC PANEL
BUN: 11 mg/dL (ref 6–23)
Creatinine, Ser: 0.66 mg/dL (ref 0.4–1.2)
GFR calc Af Amer: >60 mL/min (ref >60)
Sodium: 140 mEq/L (ref 135–145)

## 2011-09-26 LAB — URINALYSIS, ROUTINE W REFLEX MICROSCOPIC
Nitrite: NEGATIVE
Specific Gravity, Urine: 1.02 (ref 1.005–1.030)
Urobilinogen, UA: 0.2 mg/dL (ref 0.0–1.0)

## 2011-09-26 LAB — CBC
HCT: 40.4 % (ref 36.0–46.0)
MCHC: 34.6 g/dL (ref 30.0–36.0)
MCV: 94.6 fL (ref 78.0–100.0)
Platelets: 376 10*3/uL (ref 150–400)
WBC: 10.5 10*3/uL (ref 4.0–10.5)

## 2011-09-26 LAB — URINE MICROSCOPIC-ADD ON

## 2011-09-26 LAB — DIFFERENTIAL
Basophils Relative: 0 % (ref 0–1)
Eosinophils Absolute: 0.1 10*3/uL (ref 0.0–0.7)
Eosinophils Relative: 1 % (ref 0–5)
Lymphs Abs: 2.2 10*3/uL (ref 0.7–4.0)
Neutrophils Relative %: 73 % (ref 43–77)

## 2011-09-26 LAB — RAPID URINE DRUG SCREEN, HOSP PERFORMED
Amphetamines: NOT DETECTED
Benzodiazepines: NOT DETECTED
Tetrahydrocannabinol: NOT DETECTED

## 2011-09-26 LAB — PREGNANCY, URINE: Preg Test, Ur: NEGATIVE

## 2011-10-06 LAB — CBC
HCT: 29.1 — ABNORMAL LOW
HCT: 33.3 — ABNORMAL LOW
Hemoglobin: 11.6 — ABNORMAL LOW
MCHC: 34.7
MCHC: 34.7
MCV: 93.1
MCV: 94.6
Platelets: 475 — ABNORMAL HIGH
RBC: 3.08 — ABNORMAL LOW
RBC: 3.57 — ABNORMAL LOW
RDW: 14.5 — ABNORMAL HIGH
WBC: 13.6 — ABNORMAL HIGH
WBC: 17.1 — ABNORMAL HIGH

## 2011-10-08 LAB — URINALYSIS, ROUTINE W REFLEX MICROSCOPIC
Bilirubin Urine: NEGATIVE
Glucose, UA: NEGATIVE
Hgb urine dipstick: NEGATIVE
Ketones, ur: NEGATIVE
Protein, ur: NEGATIVE
pH: 6.5

## 2011-10-08 LAB — RAPID URINE DRUG SCREEN, HOSP PERFORMED
Amphetamines: NOT DETECTED
Barbiturates: NOT DETECTED
Benzodiazepines: NOT DETECTED
Cocaine: NOT DETECTED
Opiates: NOT DETECTED

## 2011-10-08 LAB — URINE MICROSCOPIC-ADD ON

## 2012-01-13 ENCOUNTER — Emergency Department (HOSPITAL_BASED_OUTPATIENT_CLINIC_OR_DEPARTMENT_OTHER)
Admission: EM | Admit: 2012-01-13 | Discharge: 2012-01-13 | Disposition: A | Discharge status: Home / Self Care | Payer: Medicaid Other | Attending: Emergency Medicine | Admitting: Emergency Medicine

## 2012-01-13 ENCOUNTER — Encounter (HOSPITAL_BASED_OUTPATIENT_CLINIC_OR_DEPARTMENT_OTHER): Payer: Self-pay | Admitting: *Deleted

## 2012-01-13 DIAGNOSIS — F17200 Nicotine dependence, unspecified, uncomplicated: Secondary | ICD-10-CM | POA: Insufficient documentation

## 2012-01-13 DIAGNOSIS — G43909 Migraine, unspecified, not intractable, without status migrainosus: Secondary | ICD-10-CM | POA: Insufficient documentation

## 2012-01-13 DIAGNOSIS — H53149 Visual discomfort, unspecified: Secondary | ICD-10-CM | POA: Insufficient documentation

## 2012-01-13 DIAGNOSIS — R110 Nausea: Secondary | ICD-10-CM | POA: Insufficient documentation

## 2012-01-13 DIAGNOSIS — F411 Generalized anxiety disorder: Secondary | ICD-10-CM | POA: Insufficient documentation

## 2012-01-13 MED ORDER — DEXAMETHASONE SODIUM PHOSPHATE 10 MG/ML IJ SOLN
10.0000 mg | Freq: Once | INTRAMUSCULAR | Status: AC
  Administered 2012-01-13: 10 mg via INTRAVENOUS
  Filled 2012-01-13: qty 1

## 2012-01-13 MED ORDER — SODIUM CHLORIDE 0.9 % IV BOLUS (SEPSIS)
1000.0000 mL | Freq: Once | INTRAVENOUS | Status: AC
  Administered 2012-01-13: 1000 mL via INTRAVENOUS

## 2012-01-13 MED ORDER — KETOROLAC TROMETHAMINE 30 MG/ML IJ SOLN
30.0000 mg | Freq: Once | INTRAMUSCULAR | Status: AC
  Administered 2012-01-13: 30 mg via INTRAVENOUS
  Filled 2012-01-13: qty 1

## 2012-01-13 MED ORDER — METOCLOPRAMIDE HCL 5 MG/ML IJ SOLN
10.0000 mg | Freq: Once | INTRAMUSCULAR | Status: AC
  Administered 2012-01-13: 10 mg via INTRAVENOUS
  Filled 2012-01-13: qty 2

## 2012-01-13 MED ORDER — DIPHENHYDRAMINE HCL 50 MG/ML IJ SOLN
25.0000 mg | Freq: Once | INTRAMUSCULAR | Status: AC
  Administered 2012-01-13: 07:00:00 via INTRAVENOUS
  Filled 2012-01-13: qty 1

## 2012-01-13 NOTE — ED Provider Notes (Signed)
History     CSN: [id number]  Arrival date & time 01/13/12  4098   First MD Initiated Contact with Patient 01/13/12 [phone]      Chief Complaint  Patient presents with  . Migraine    (Consider location/radiation/quality/duration/timing/severity/associated sxs/prior treatment) HPI Comments: Has a hx of headaches in past.  Onset overnight and has gradually gotten worse.  Some mild blurred vision but denies any additional neuro sx  Patient is a 31 y.o. female presenting with migraine. The history is provided by the patient. No language interpreter was used.  Migraine This is a new problem. The current episode started 3 to 5 hours ago. The problem occurs constantly. The problem has been gradually worsening. Pertinent negatives include no chest pain, no abdominal pain, no headaches and no shortness of breath. Exacerbated by: light and sound. The symptoms are relieved by nothing. She has tried nothing for the symptoms. The treatment provided no relief.    Past Medical History  Diagnosis Date  . Back pain   . Anxiety   . Ovarian cyst     Past Surgical History  Procedure Date  . Ovarian cyst removal     No family history on file.  History  Substance Use Topics  . Smoking status: Current Everyday Smoker  . Smokeless tobacco: Not on file  . Alcohol Use: No    OB History    Grav Para Term Preterm Abortions TAB SAB Ect Mult Living                  Review of Systems  Constitutional: Negative for fever, activity change, appetite change and fatigue.  HENT: Negative for congestion, sore throat, rhinorrhea, neck pain and neck stiffness.   Eyes: Positive for photophobia. Negative for pain, discharge, redness and visual disturbance.  Respiratory: Negative for cough and shortness of breath.   Cardiovascular: Negative for chest pain and palpitations.  Gastrointestinal: Positive for nausea. Negative for vomiting, abdominal pain, diarrhea and constipation.  Genitourinary: Negative for  dysuria, urgency, frequency and flank pain.  Musculoskeletal: Negative for myalgias, back pain and arthralgias.  Neurological: Negative for dizziness, weakness, light-headedness, numbness and headaches.  All other systems reviewed and are negative.    Allergies  Review of patient's allergies indicates no known allergies.  Home Medications   Current Outpatient Rx  Name Route Sig Dispense Refill  . CYCLOBENZAPRINE HCL 10 MG PO TABS Oral Take 10 mg by mouth 3 (three) times daily as needed.      Marland Kitchen DIAZEPAM 10 MG PO TABS Oral Take 10 mg by mouth every 6 (six) hours as needed.      Marland Kitchen MEDROXYPROGESTERONE ACETATE 150 MG/ML IM SUSP Intramuscular Inject 150 mg into the muscle every 3 (three) months.      . OXYCODONE HCL 30 MG PO TABS Oral Take 30 mg by mouth every 4 (four) hours as needed.        BP 138/97  Pulse 74  Temp(Src) 98.4 F (36.9 C) (Oral)  Resp 18  Ht 5\' 3"  (1.6 m)  Wt 130 lb (58.968 kg)  BMI 23.03 kg/m2  SpO2 97%  Physical Exam  Nursing note and vitals reviewed. Constitutional: She is oriented to person, place, and time. She appears well-developed and well-nourished.       Appears uncomfortable  HENT:  Head: Normocephalic and atraumatic.  Mouth/Throat: Oropharynx is clear and moist. No oropharyngeal exudate.  Eyes: Conjunctivae and EOM are normal. Pupils are equal, round, and reactive to light.       +  photophobia  Neck: Normal range of motion. Neck supple.  Cardiovascular: Normal rate, regular rhythm, normal heart sounds and intact distal pulses.  Exam reveals no gallop and no friction rub.   No murmur heard. Pulmonary/Chest: Effort normal and breath sounds normal. No respiratory distress.  Abdominal: Soft. Bowel sounds are normal. There is no tenderness.  Musculoskeletal: Normal range of motion. She exhibits no tenderness.  Neurological: She is alert and oriented to person, place, and time. No cranial nerve deficit.  Skin: Skin is warm and dry.    ED Course    Procedures (including critical care time)  Labs Reviewed - No data to display No results found.   1. Migraine headache       MDM  Consistent with a migraine headache. Her headache was in fact gradual in onset and consistent with prior headaches. I have no concern about a malignant cause of headache such as subarachnoid hemorrhage or meningitis. There is no indication for blood work or imaging at this time. After receiving the headache cocktail on reassessment her headache had improved. It is nearly resolved. This time the patient wishes to be discharged home to continue resting at home. She has no neurologic symptoms. She's instructed to followup with her primary care physician. There could be an evidence of rebound headache as she is on chronic narcotic medications        Dayton Bailiff, MD 01/13/12 [phone]

## 2012-01-13 NOTE — ED Notes (Signed)
Sudden onset severe headache (pt has hx of same). +photosensitivity. +nausea (no vomiting)

## 2012-06-16 ENCOUNTER — Emergency Department (HOSPITAL_BASED_OUTPATIENT_CLINIC_OR_DEPARTMENT_OTHER)
Admission: EM | Admit: 2012-06-16 | Discharge: 2012-06-16 | Disposition: A | Discharge status: Home / Self Care | Payer: Medicaid Other | Attending: Emergency Medicine | Admitting: Emergency Medicine

## 2012-06-16 ENCOUNTER — Encounter (HOSPITAL_BASED_OUTPATIENT_CLINIC_OR_DEPARTMENT_OTHER): Payer: Self-pay

## 2012-06-16 DIAGNOSIS — F17200 Nicotine dependence, unspecified, uncomplicated: Secondary | ICD-10-CM | POA: Insufficient documentation

## 2012-06-16 DIAGNOSIS — J039 Acute tonsillitis, unspecified: Secondary | ICD-10-CM | POA: Insufficient documentation

## 2012-06-16 MED ORDER — PENICILLIN G BENZATHINE 1200000 UNIT/2ML IM SUSP
1.2000 10*6.[IU] | Freq: Once | INTRAMUSCULAR | Status: AC
  Administered 2012-06-16: 1.2 10*6.[IU] via INTRAMUSCULAR
  Filled 2012-06-16: qty 2

## 2012-06-16 MED ORDER — OXYCODONE HCL 5 MG/5ML PO SOLN: 5.0000 mg | ORAL | Status: DC | PRN

## 2012-06-16 MED ORDER — ONDANSETRON 8 MG PO TBDP: 8.0000 mg | ORAL_TABLET | Freq: Three times a day (TID) | ORAL | Status: AC | PRN

## 2012-06-16 MED ORDER — DEXAMETHASONE SODIUM PHOSPHATE 10 MG/ML IJ SOLN
10.0000 mg | Freq: Once | INTRAMUSCULAR | Status: AC
  Administered 2012-06-16: 10 mg via INTRAMUSCULAR
  Filled 2012-06-16: qty 1

## 2012-06-16 NOTE — ED Notes (Signed)
Noted right side tonsil swollen and red,pt sts painful 8/10 pain scale

## 2012-06-16 NOTE — Discharge Instructions (Signed)
Tonsillitis Tonsils are lumps of lymphoid tissues at the back of the throat. Each tonsil has 20 crevices (crypts). Tonsils help fight nose and throat infections and keep infection from spreading to other parts of the body for the first 18 months of life. Tonsillitis is an infection of the throat that causes the tonsils to become red, tender, and swollen. CAUSES Sudden and, if treated, temporary (acute) tonsillitis is usually caused by infection with streptococcal bacteria. Long lasting (chronic) tonsillitis occurs when the crypts of the tonsils become filled with pieces of food and bacteria, which makes it easy for the tonsils to become constantly infected. SYMPTOMS  Symptoms of tonsillitis include:  A sore throat.   White patches on the tonsils.   Fever.   Tiredness.  DIAGNOSIS Tonsillitis can be diagnosed through a physical exam. Diagnosis can be confirmed with the results of lab tests, including a throat culture. TREATMENT  The goals of tonsillitis treatment include the reduction of the severity and duration of symptoms, prevention of associated conditions, and prevention of disease transmission. Tonsillitis caused by bacteria can be treated with antibiotics. Usually, treatment with antibiotics is started before the cause of the tonsillitis is known. However, if it is determined that the cause is not bacterial, antibiotics will not treat the tonsillitis. If attacks of tonsillitis are severe and frequent, your caregiver may recommend surgery to remove the tonsils (tonsillectomy). HOME CARE INSTRUCTIONS   Rest as much as possible and get plenty of sleep.   Drink plenty of fluids. While the throat is very sore, eat soft foods or liquids, such as sherbet, soups, or instant breakfast drinks.   Eat frozen ice pops.   Older children and adults may gargle with a warm or cold liquid to help soothe the throat. Mix 1 teaspoon of salt in 1 cup of water.   Other family members who also develop a  sore throat or fever should have a medical exam or throat culture.   Only take over-the-counter or prescription medicines for pain, discomfort, or fever as directed by your caregiver.   If you are given antibiotics, take them as directed. Finish them even if you start to feel better.  SEEK MEDICAL CARE IF:   Your baby is older than 3 months with a rectal temperature of 100.5 F (38.1 C) or higher for more than 1 day.   Large, tender lumps develop in your neck.   A rash develops.   Green, yellow-brown, or bloody substance is coughed up.   You are unable to swallow liquids or food for 24 hours.   Your child is unable to swallow food or liquids for 12 hours.  SEEK IMMEDIATE MEDICAL CARE IF:   You develop any new symptoms such as vomiting, severe headache, stiff neck, chest pain, or trouble breathing or swallowing.   You have severe throat pain along with drooling or voice changes.   You have severe pain, unrelieved with recommended medications.   You are unable to fully open the mouth.   You develop redness, swelling, or severe pain anywhere in the neck.   You have a fever.   Your baby is older than 3 months with a rectal temperature of 102 F (38.9 C) or higher.   Your baby is 12 months old or younger with a rectal temperature of 100.4 F (38 C) or higher.  MAKE SURE YOU:   Understand these instructions.   Will watch your condition.   Will get help right away if you are not  watch your condition.   Will get help right away if you are not doing well or get worse.  Document Released: 09/17/2005 Document Revised: 11/27/2011 Document Reviewed: 02/13/2011  [employer] Patient Information 2012 [employer].

## 2012-06-16 NOTE — ED Notes (Signed)
Pt sts right side tonsil swollen unable to eat. Vomit x 2 times last night

## 2012-06-16 NOTE — ED Provider Notes (Signed)
History     CSN: [id number]  Arrival date & time 06/16/12  4696   First MD Initiated Contact with Patient 06/16/12 [phone]      Chief Complaint  Patient presents with  . Sore Throat    (Consider location/radiation/quality/duration/timing/severity/associated sxs/prior treatment) HPI Patient is a 31 year old female who presents with 2 days of sore throat was pain with swallowing. Patient denies fevers. She does report nausea beginning last night with 2 episodes of emesis. She denies any cough, headache, congestion, or abdominal pain. She has no known sick contacts or prior health issues. Pain is worse with trying to eat or drink. Nothing has made the pain better. Patient describes the pain as aching and more prominent on the right. She has full range of neck motion. Patient denies neck pain or rashes. There are no other associated or modifying factors. Past Medical History  Diagnosis Date  . Back pain   . Anxiety   . Ovarian cyst     Past Surgical History  Procedure Date  . Ovarian cyst removal     History reviewed. No pertinent family history.  History  Substance Use Topics  . Smoking status: Current Everyday Smoker  . Smokeless tobacco: Not on file  . Alcohol Use: No    OB History    Grav Para Term Preterm Abortions TAB SAB Ect Mult Living                  Review of Systems  Constitutional: Negative.   HENT: Positive for sore throat.   Eyes: Negative.   Respiratory: Negative.   Cardiovascular: Negative.   Gastrointestinal: Positive for nausea and vomiting.  Genitourinary: Negative.   Musculoskeletal: Negative.   Skin: Negative.   Neurological: Negative.   Hematological: Negative.   Psychiatric/Behavioral: Negative.   All other systems reviewed and are negative.    Allergies  Review of patient's allergies indicates no known allergies.  Home Medications   Current Outpatient Rx  Name Route Sig Dispense Refill  . CYCLOBENZAPRINE HCL 10 MG PO TABS Oral Take  10 mg by mouth 3 (three) times daily as needed.      Marland Kitchen DIAZEPAM 10 MG PO TABS Oral Take 10 mg by mouth every 6 (six) hours as needed.      Marland Kitchen MEDROXYPROGESTERONE ACETATE 150 MG/ML IM SUSP Intramuscular Inject 150 mg into the muscle every 3 (three) months.      . ONDANSETRON 8 MG PO TBDP Oral Take 1 tablet (8 mg total) by mouth every 8 (eight) hours as needed for nausea. 20 tablet 0  . OXYCODONE HCL 30 MG PO TABS Oral Take 30 mg by mouth every 4 (four) hours as needed.      . OXYCODONE HCL 5 MG/5ML PO SOLN Oral Take 5 mLs (5 mg total) by mouth every 4 (four) hours as needed for pain. 100 mL 0    BP 131/80  Pulse 99  Temp 99.2 F (37.3 C) (Oral)  Resp 20  SpO2 99%  Physical Exam  Nursing note and vitals reviewed.  GEN: Well-developed, well-nourished female in no distress, uncomfortable appearing HEENT: Atraumatic, normocephalic. Oropharynx is bilateral tonsillar erythema with exudates  EYES: PERRLA BL, no scleral icterus. NECK: Trachea midline, no meningismus, bilateral cervical lymphadenopathy noted, full range of motion CV: regular rate and rhythm. No murmurs, rubs, or gallops PULM: No respiratory distress.  No crackles, wheezes, or rales. GI: soft, non-tender. No guarding, rebound, or tenderness. + bowel sounds  GU: deferred Neuro: cranial nerves  grossly 2-12 intact, no abnormalities of strength or sensation, A and O x 3 MSK: Patient moves all 4 extremities symmetrically, no deformity, edema, or injury noted Skin: No rashes petechiae, purpura, or jaundice Psych: no abnormality of mood  ED Course  Procedures (including critical care time)  Labs Reviewed - No data to display No results found.   1. Tonsillitis with exudate       MDM  Patient was evaluated by myself. Exam was consistent with tonsillitis with exudates. Patient was afebrile and hemodynamically stable though very uncomfortable. Patient was able to drink. She no longer felt nauseous.. She was given a shot of  Bicillin as well as Decadron. Patient was given prescription for Zofran for nausea as well as Roxicet for pain. She was having no difficulty swallowing or breathing. She had full range of motion of the neck. Given that she had no difficulty with rotation of the head or flexion extension of the neck I have no concern for peritonsillar abscess or retropharyngeal abscess. Patient did not have any stridor to suggest epiglottitis. She no difficulty breathing. Patient no associated cough to suggest a pneumonia. Patient was felt safe for discharge and discharged in good condition with the prescriptions as mentioned previously.        Cyndra Numbers, MD 06/16/12 [phone]

## 2012-07-20 ENCOUNTER — Emergency Department (HOSPITAL_BASED_OUTPATIENT_CLINIC_OR_DEPARTMENT_OTHER)
Admission: EM | Admit: 2012-07-20 | Discharge: 2012-07-21 | Disposition: A | Discharge status: Home / Self Care | Payer: Medicaid Other | Attending: Emergency Medicine | Admitting: Emergency Medicine

## 2012-07-20 ENCOUNTER — Encounter (HOSPITAL_BASED_OUTPATIENT_CLINIC_OR_DEPARTMENT_OTHER): Payer: Self-pay | Admitting: Emergency Medicine

## 2012-07-20 ENCOUNTER — Emergency Department (HOSPITAL_BASED_OUTPATIENT_CLINIC_OR_DEPARTMENT_OTHER): Payer: Medicaid Other

## 2012-07-20 DIAGNOSIS — F17200 Nicotine dependence, unspecified, uncomplicated: Secondary | ICD-10-CM | POA: Insufficient documentation

## 2012-07-20 DIAGNOSIS — F411 Generalized anxiety disorder: Secondary | ICD-10-CM | POA: Insufficient documentation

## 2012-07-20 DIAGNOSIS — R071 Chest pain on breathing: Secondary | ICD-10-CM | POA: Insufficient documentation

## 2012-07-20 DIAGNOSIS — R0789 Other chest pain: Secondary | ICD-10-CM

## 2012-07-20 DIAGNOSIS — G43909 Migraine, unspecified, not intractable, without status migrainosus: Secondary | ICD-10-CM | POA: Insufficient documentation

## 2012-07-20 HISTORY — DX: Migraine, unspecified, not intractable, without status migrainosus: G43.909

## 2012-07-20 LAB — BASIC METABOLIC PANEL
CO2: 30 mEq/L (ref 19–32)
Calcium: 10 mg/dL (ref 8.4–10.5)
Creatinine, Ser: 0.6 mg/dL (ref 0.50–1.10)
GFR calc non Af Amer: >90 mL/min (ref >90)
Sodium: 138 mEq/L (ref 135–145)

## 2012-07-20 LAB — CBC WITH DIFFERENTIAL/PLATELET
Basophils Absolute: 0 10*3/uL (ref 0.0–0.1)
Eosinophils Absolute: 0.2 10*3/uL (ref 0.0–0.7)
Eosinophils Relative: 2 % (ref 0–5)
HCT: 41.5 % (ref 36.0–46.0)
Lymphocytes Relative: 37 % (ref 12–46)
MCH: 32.2 pg (ref 26.0–34.0)
MCHC: 35.2 g/dL (ref 30.0–36.0)
MCV: 91.6 fL (ref 78.0–100.0)
Monocytes Absolute: 1.1 10*3/uL — ABNORMAL HIGH (ref 0.1–1.0)
Platelets: 445 10*3/uL — ABNORMAL HIGH (ref 150–400)
RDW: 12.9 % (ref 11.5–15.5)
WBC: 12.3 10*3/uL — ABNORMAL HIGH (ref 4.0–10.5)

## 2012-07-20 LAB — D-DIMER, QUANTITATIVE: D-Dimer, Quant: <0.22 ug/mL-FEU (ref 0.00–0.48)

## 2012-07-20 NOTE — ED Provider Notes (Signed)
History     CSN: [id number]  Arrival date & time 07/20/12  2237   First MD Initiated Contact with Patient 07/20/12 2308      Chief Complaint  Patient presents with  . Chest Pain  . Headache  . Shortness of Breath    (Consider location/radiation/quality/duration/timing/severity/associated sxs/prior treatment) Patient is a 31 y.o. female presenting with chest pain, headaches, and shortness of breath. The history is provided by the patient. No language interpreter was used.  Chest Pain The chest pain began 12 - 24 hours ago. Duration of episode(s) is 13 hours. Chest pain occurs constantly. The chest pain is unchanged. The pain is associated with breathing. At its most intense, the pain is at 10/10. The pain is currently at 10/10. The severity of the pain is severe. The quality of the pain is described as dull. The pain does not radiate. Primary symptoms include shortness of breath. Pertinent negatives for primary symptoms include no fever and no cough.  Pertinent negatives for associated symptoms include no claudication, no diaphoresis, no lower extremity edema, no numbness and no weakness. She tried narcotics for the symptoms. Risk factors include oral contraceptive use.  Pertinent negatives for past medical history include no aneurysm, no MI and no seizures.  Procedure history is negative for cardiac catheterization.    Headache  This is a recurrent problem. The current episode started more than 2 days ago. The problem occurs constantly. The problem has not changed since onset.The headache is associated with nothing. The pain is located in the frontal region. The quality of the pain is described as throbbing. The pain is at a severity of 7/10. Associated symptoms include shortness of breath. Pertinent negatives include no fever. She has tried oral narcotic analgesics for the symptoms. The treatment provided no relief.  Shortness of Breath  Associated symptoms include chest pain and  shortness of breath. Pertinent negatives include no fever and no cough.  Headache is not sudden onset.  Non radiating.  No f/c/r.  No tick exposure.  No weakness, no numbness, no changes in speech or cognition.  No changes in vision.  No neck pain or stiffness.  No rashes on the skin  Past Medical History  Diagnosis Date  . Back pain   . Anxiety   . Ovarian cyst   . Migraine     Past Surgical History  Procedure Date  . Ovarian cyst removal     No family history on file.  History  Substance Use Topics  . Smoking status: Current Everyday Smoker  . Smokeless tobacco: Not on file  . Alcohol Use: No    OB History    Grav Para Term Preterm Abortions TAB SAB Ect Mult Living                  Review of Systems  Constitutional: Negative for fever and diaphoresis.  HENT: Negative for neck pain and neck stiffness.   Eyes: Negative for photophobia and visual disturbance.  Respiratory: Positive for shortness of breath. Negative for cough.   Cardiovascular: Positive for chest pain. Negative for claudication.  Neurological: Positive for headaches. Negative for seizures, facial asymmetry, speech difficulty, weakness and numbness.  All other systems reviewed and are negative.    Allergies  Zoloft  Home Medications   Current Outpatient Rx  Name Route Sig Dispense Refill  . CYCLOBENZAPRINE HCL 10 MG PO TABS Oral Take 10 mg by mouth 3 (three) times daily as needed. For spasms.    Marland Kitchen  DIAZEPAM 10 MG PO TABS Oral Take 10 mg by mouth every 6 (six) hours as needed. For anxiety.    . OXYCODONE HCL 30 MG PO TABS Oral Take 30 mg by mouth every 4 (four) hours as needed. For pain.    Marland Kitchen OXYMORPHONE HCL ER 30 MG PO TB12 Oral Take 30 mg by mouth every 12 (twelve) hours. For back pain.    Marland Kitchen MEDROXYPROGESTERONE ACETATE 150 MG/ML IM SUSP Intramuscular Inject 150 mg into the muscle every 3 (three) months.        BP 130/78  Pulse 102  Temp 98.1 F (36.7 C) (Oral)  Resp 22  Ht 5\' 3"  (1.6 m)  Wt  140 lb (63.504 kg)  BMI 24.80 kg/m2  SpO2 95%  Physical Exam  Constitutional: She is oriented to person, place, and time. She appears well-developed and well-nourished.  HENT:  Head: Normocephalic and atraumatic.  Mouth/Throat: Oropharynx is clear and moist.  Eyes: Conjunctivae and EOM are normal. Pupils are equal, round, and reactive to light.  Neck: Normal range of motion. Neck supple.  Cardiovascular: Normal rate and regular rhythm.   Pulmonary/Chest: Effort normal and breath sounds normal. She has no wheezes. She has no rales. She exhibits tenderness.  Abdominal: Soft. Bowel sounds are normal. There is no tenderness. There is no rebound and no guarding.  Musculoskeletal: Normal range of motion. She exhibits no tenderness.  Lymphadenopathy:    She has no cervical adenopathy.  Neurological: She is alert and oriented to person, place, and time. She has normal reflexes. She displays normal reflexes. No cranial nerve deficit. She exhibits normal muscle tone.  Skin: Skin is warm and dry. No rash noted. She is not diaphoretic.  Psychiatric: Her mood appears anxious.    ED Course  Procedures (including critical care time)  Labs Reviewed  CBC WITH DIFFERENTIAL - Abnormal; Notable for the following:    WBC 12.3 (*)     Platelets 445 (*)     Lymphs Abs 4.6 (*)     Monocytes Absolute 1.1 (*)     All other components within normal limits  PREGNANCY, URINE  BASIC METABOLIC PANEL  D-DIMER, QUANTITATIVE  TROPONIN I   No results found.   No diagnosis found.    MDM   Date: 07/20/2012  Rate: 92  Rhythm: normal sinus rhythm  QRS Axis: normal  Intervals: normal  ST/T Wave abnormalities: normal  Conduction Disutrbances: none  Narrative Interpretation: unremarkable    Reassessed with nurse present prior to d/c.  Cn 2-12 intact.  Chest pain is musculoskeletal in nature.  SOB is  Related to patient clearly anxious.  Headache is recurrent.  No tick exposure nor focal features.  Exam  and vitals reassuring.  No indication for LP or advanced imaging at this time.  Patient should return immediately for worsening chest pain, shortness or breath, worsening headache, fevers > 101 stiff neck, weakness, numbness changes in thinking inability to move eyes or any concerns.  Follow up within 2 days with your family doctor for ongoing care of your headache and chest pain.  Patient and family verbalize understanding and agree to follow up       Barclay Lennox Smitty Cords, MD 07/21/12 1610

## 2012-07-20 NOTE — ED Notes (Signed)
Pt very anxious and hysterical crying in triage. Attempted to calm pt. Pt c/o chest pain, shob, nausea since this morning and headache x 3 days.

## 2012-07-21 ENCOUNTER — Emergency Department (HOSPITAL_BASED_OUTPATIENT_CLINIC_OR_DEPARTMENT_OTHER): Payer: Medicaid Other

## 2012-07-21 MED ORDER — KETOROLAC TROMETHAMINE 30 MG/ML IJ SOLN
30.0000 mg | Freq: Once | INTRAMUSCULAR | Status: AC
  Administered 2012-07-21: 30 mg via INTRAVENOUS
  Filled 2012-07-21: qty 1

## 2012-07-21 MED ORDER — NAPROXEN 375 MG PO TABS: 375.0000 mg | ORAL_TABLET | Freq: Two times a day (BID) | ORAL | Status: AC

## 2012-07-21 MED ORDER — METOCLOPRAMIDE HCL 5 MG/ML IJ SOLN
10.0000 mg | Freq: Once | INTRAMUSCULAR | Status: AC
  Administered 2012-07-21: 10 mg via INTRAVENOUS
  Filled 2012-07-21: qty 2

## 2012-07-21 MED ORDER — DIPHENHYDRAMINE HCL 50 MG/ML IJ SOLN
12.5000 mg | Freq: Once | INTRAMUSCULAR | Status: AC
  Administered 2012-07-21: 12.5 mg via INTRAVENOUS
  Filled 2012-07-21: qty 1

## 2013-08-31 ENCOUNTER — Emergency Department (HOSPITAL_BASED_OUTPATIENT_CLINIC_OR_DEPARTMENT_OTHER)
Admission: EM | Admit: 2013-08-31 | Discharge: 2013-08-31 | Disposition: A | Discharge status: Home / Self Care | Payer: Medicaid Other | Attending: Emergency Medicine | Admitting: Emergency Medicine

## 2013-08-31 ENCOUNTER — Encounter (HOSPITAL_BASED_OUTPATIENT_CLINIC_OR_DEPARTMENT_OTHER): Payer: Self-pay | Admitting: *Deleted

## 2013-08-31 ENCOUNTER — Inpatient Hospital Stay (EMERGENCY_DEPARTMENT_HOSPITAL)
Admission: AD | Admit: 2013-08-31 | Discharge: 2013-08-31 | Disposition: A | Discharge status: Home / Self Care | Payer: Medicaid Other | Source: Ambulatory Visit | Attending: Family Medicine | Admitting: Family Medicine

## 2013-08-31 ENCOUNTER — Encounter (HOSPITAL_COMMUNITY): Payer: Self-pay

## 2013-08-31 ENCOUNTER — Inpatient Hospital Stay (HOSPITAL_COMMUNITY): Payer: Medicaid Other

## 2013-08-31 DIAGNOSIS — R102 Pelvic and perineal pain: Secondary | ICD-10-CM

## 2013-08-31 DIAGNOSIS — R52 Pain, unspecified: Secondary | ICD-10-CM | POA: Insufficient documentation

## 2013-08-31 DIAGNOSIS — Z8742 Personal history of other diseases of the female genital tract: Secondary | ICD-10-CM | POA: Insufficient documentation

## 2013-08-31 DIAGNOSIS — Z9889 Other specified postprocedural states: Secondary | ICD-10-CM | POA: Insufficient documentation

## 2013-08-31 DIAGNOSIS — N898 Other specified noninflammatory disorders of vagina: Secondary | ICD-10-CM | POA: Insufficient documentation

## 2013-08-31 DIAGNOSIS — F17200 Nicotine dependence, unspecified, uncomplicated: Secondary | ICD-10-CM | POA: Insufficient documentation

## 2013-08-31 DIAGNOSIS — R109 Unspecified abdominal pain: Secondary | ICD-10-CM

## 2013-08-31 DIAGNOSIS — Z8679 Personal history of other diseases of the circulatory system: Secondary | ICD-10-CM | POA: Insufficient documentation

## 2013-08-31 DIAGNOSIS — N949 Unspecified condition associated with female genital organs and menstrual cycle: Secondary | ICD-10-CM | POA: Insufficient documentation

## 2013-08-31 DIAGNOSIS — R1031 Right lower quadrant pain: Secondary | ICD-10-CM | POA: Insufficient documentation

## 2013-08-31 DIAGNOSIS — F411 Generalized anxiety disorder: Secondary | ICD-10-CM | POA: Insufficient documentation

## 2013-08-31 DIAGNOSIS — G8929 Other chronic pain: Secondary | ICD-10-CM | POA: Insufficient documentation

## 2013-08-31 DIAGNOSIS — F1110 Opioid abuse, uncomplicated: Secondary | ICD-10-CM | POA: Insufficient documentation

## 2013-08-31 DIAGNOSIS — Z3202 Encounter for pregnancy test, result negative: Secondary | ICD-10-CM | POA: Insufficient documentation

## 2013-08-31 HISTORY — DX: Unspecified convulsions: R56.9

## 2013-08-31 LAB — CBC WITH DIFFERENTIAL/PLATELET
Basophils Relative: 0 % (ref 0–1)
Eosinophils Absolute: 0.4 10*3/uL (ref 0.0–0.7)
Eosinophils Relative: 3 % (ref 0–5)
MCH: 31.7 pg (ref 26.0–34.0)
MCHC: 34.8 g/dL (ref 30.0–36.0)
MCV: 91.1 fL (ref 78.0–100.0)
Monocytes Relative: 10 % (ref 3–12)
Neutrophils Relative %: 53 % (ref 43–77)
Platelets: 413 10*3/uL — ABNORMAL HIGH (ref 150–400)

## 2013-08-31 LAB — URINALYSIS, ROUTINE W REFLEX MICROSCOPIC
Bilirubin Urine: NEGATIVE
Glucose, UA: NEGATIVE mg/dL
Hgb urine dipstick: NEGATIVE
Ketones, ur: NEGATIVE mg/dL
Leukocytes, UA: NEGATIVE
Nitrite: NEGATIVE
Protein, ur: NEGATIVE mg/dL
Specific Gravity, Urine: 1.017 (ref 1.005–1.030)
Urobilinogen, UA: 1 mg/dL (ref 0.0–1.0)
pH: 6.5 (ref 5.0–8.0)

## 2013-08-31 LAB — COMPREHENSIVE METABOLIC PANEL
Albumin: 3.5 g/dL (ref 3.5–5.2)
Alkaline Phosphatase: 71 U/L (ref 39–117)
BUN: 5 mg/dL — ABNORMAL LOW (ref 6–23)
Calcium: 9.7 mg/dL (ref 8.4–10.5)
GFR calc Af Amer: >90 mL/min (ref >90)
Potassium: 3.8 mEq/L (ref 3.5–5.1)
Sodium: 137 mEq/L (ref 135–145)
Total Protein: 6.2 g/dL (ref 6.0–8.3)

## 2013-08-31 LAB — GC/CHLAMYDIA PROBE AMP
CT Probe RNA: NEGATIVE
GC Probe RNA: NEGATIVE

## 2013-08-31 LAB — WET PREP, GENITAL
Clue Cells Wet Prep HPF POC: NONE SEEN
Trich, Wet Prep: NONE SEEN

## 2013-08-31 LAB — PREGNANCY, URINE: Preg Test, Ur: NEGATIVE

## 2013-08-31 MED ORDER — PROMETHAZINE HCL 25 MG PO TABS
25.0000 mg | ORAL_TABLET | Freq: Once | ORAL | Status: AC
  Administered 2013-08-31: 25 mg via ORAL
  Filled 2013-08-31: qty 1

## 2013-08-31 MED ORDER — KETOROLAC TROMETHAMINE 60 MG/2ML IM SOLN
60.0000 mg | Freq: Once | INTRAMUSCULAR | Status: AC
  Administered 2013-08-31: 60 mg via INTRAMUSCULAR
  Filled 2013-08-31: qty 2

## 2013-08-31 MED ORDER — HYDROMORPHONE HCL 2 MG PO TABS
2.0000 mg | ORAL_TABLET | Freq: Once | ORAL | Status: AC
  Administered 2013-08-31: 2 mg via ORAL
  Filled 2013-08-31: qty 1

## 2013-08-31 MED ORDER — HYDROMORPHONE HCL PF 2 MG/ML IJ SOLN
2.0000 mg | Freq: Once | INTRAMUSCULAR | Status: AC
  Administered 2013-08-31: 2 mg via INTRAMUSCULAR
  Filled 2013-08-31: qty 1

## 2013-08-31 NOTE — ED Provider Notes (Addendum)
CSN: [id number]     Arrival date & time 08/31/13  0155 History   First MD Initiated Contact with Patient 08/31/13 0203     Chief Complaint  Patient presents with  . Abdominal Pain   (Consider location/radiation/quality/duration/timing/severity/associated sxs/prior Treatment) HPI This is a 32 year old female on high dose daily narcotics for chronic pain. She is here with abdominal pain since 12:30 this morning. The pain is in the right suprapubic region. It is sharp and severe. There is no associated fever, chills, nausea, vomiting, diarrhea, constipation, hematuria, dysuria, abdominal distention or vaginal bleeding. She does have some vaginal discharge. It is worse with movement or palpation. She took about 6 to known tablet prior to arrival without relief. She is unsure of her last menstrual period as she has been on Depo-Provera shots but failed to get her most recent to push a year ago.  Past Medical History  Diagnosis Date  . Back pain   . Anxiety   . Ovarian cyst   . Migraine    Past Surgical History  Procedure Laterality Date  . Ovarian cyst removal     History reviewed. No pertinent family history. History  Substance Use Topics  . Smoking status: Current Every Day Smoker  . Smokeless tobacco: Not on file  . Alcohol Use: No   OB History   Grav Para Term Preterm Abortions TAB SAB Ect Mult Living                 Review of Systems  All other systems reviewed and are negative.    Allergies  Zoloft  Home Medications   Current Outpatient Rx  Name  Route  Sig  Dispense  Refill  . cyclobenzaprine (FLEXERIL) 10 MG tablet   Oral   Take 10 mg by mouth 3 (three) times daily as needed. For spasms.         . diazepam (VALIUM) 10 MG tablet   Oral   Take 10 mg by mouth every 6 (six) hours as needed. For anxiety.         Marland Kitchen oxyCODONE (OXYCONTIN) 40 MG 12 hr tablet   Oral   Take 40 mg by mouth every 12 (twelve) hours.         Marland Kitchen oxycodone (ROXICODONE) 30 MG immediate  release tablet   Oral   Take 30 mg by mouth every 4 (four) hours as needed. For pain.         . medroxyPROGESTERone (DEPO-PROVERA) 150 MG/ML injection   Intramuscular   Inject 150 mg into the muscle every 3 (three) months.           Marland Kitchen oxymorphone (OPANA ER) 30 MG 12 hr tablet   Oral   Take 30 mg by mouth every 12 (twelve) hours. For back pain.          BP 133/90  Pulse 100  Temp(Src) 98.3 F (36.8 C) (Oral)  Resp 22  SpO2 100%  Physical Exam General: Well-developed, well-nourished female in no acute distress but apparent discomfort; appearance consistent with age of record HENT: normocephalic; atraumatic Eyes: pupils equal, round and reactive to light; extraocular muscles intact Neck: supple Heart: regular rate and rhythm; no murmurs, rubs or gallops Lungs: clear to auscultation bilaterally Abdomen: soft; nondistended; right suprapubic tenderness; no masses or hepatosplenomegaly; bowel sounds present GU: No CVA tenderness; normal external genitalia; normal appearing cervix; no vaginal bleeding; no vaginal discharge; no cervical motion tenderness; significant right adnexal tenderness with fullness Extremities: No deformity; full range  of motion; pulses normal; no edema Neurologic: Awake, alert and oriented; motor function intact in all extremities and symmetric; no facial droop Skin: Warm and dry Psychiatric: Tearful; anxious     ED Course  Procedures (including critical care time)   MDM   Nursing notes and vitals signs, including pulse oximetry, reviewed.  Summary of this visit's results, reviewed by myself:  Labs:  Results for orders placed during the hospital encounter of 08/31/13 (from the past 24 hour(s))  URINALYSIS, ROUTINE W REFLEX MICROSCOPIC     Status: None   Collection Time    08/31/13  2:05 AM      Result Value Range   Color, Urine YELLOW  YELLOW   APPearance CLEAR  CLEAR   Specific Gravity, Urine 1.017  1.005 - 1.030   pH 6.5  5.0 - 8.0    Glucose, UA NEGATIVE  NEGATIVE mg/dL   Hgb urine dipstick NEGATIVE  NEGATIVE   Bilirubin Urine NEGATIVE  NEGATIVE   Ketones, ur NEGATIVE  NEGATIVE mg/dL   Protein, ur NEGATIVE  NEGATIVE mg/dL   Urobilinogen, UA 1.0  0.0 - 1.0 mg/dL   Nitrite NEGATIVE  NEGATIVE   Leukocytes, UA NEGATIVE  NEGATIVE  PREGNANCY, URINE     Status: None   Collection Time    08/31/13  2:05 AM      Result Value Range   Preg Test, Ur NEGATIVE  NEGATIVE  WET PREP, GENITAL     Status: Abnormal   Collection Time    08/31/13  2:20 AM      Result Value Range   Yeast Wet Prep HPF POC NONE SEEN  NONE SEEN   Trich, Wet Prep NONE SEEN  NONE SEEN   Clue Cells Wet Prep HPF POC NONE SEEN  NONE SEEN   WBC, Wet Prep HPF POC FEW (*) NONE SEEN   2:38 AM Discussed with Hilda Lias, Nurse Practitioner at [facility] Admissions Unit. We will transfer by private vehicle for emergent pelvic ultrasound to evaluate for torsion or cyst.     Hanley Seamen, MD 08/31/13 4098  Hanley Seamen, MD 08/31/13 [phone]

## 2013-08-31 NOTE — MAU Provider Note (Signed)
Chart reviewed and agree with management and plan.  

## 2013-08-31 NOTE — ED Notes (Signed)
MD at bedside. 

## 2013-08-31 NOTE — ED Notes (Signed)
C/o lower abd pain since 12:30 am, denies vaginal bleeding. Some vaginal d/c.

## 2013-08-31 NOTE — MAU Provider Note (Signed)
History     CSN: [id number]  Arrival date and time: 08/31/13 1478   First Provider Initiated Contact with Patient 08/31/13 0326      No chief complaint on file.  HPI This is a 32 y.o. female who was sent from the ED for ultrasound evaluation. She presented with severe RLQ pain as described below. Took Oxycodone at home at [facility] without relief and got Dilaudid at 2am without relief. They did a pelvic exam there and it was negative for CMT.  The presumption was that she might have an ovarian cyst, as she has had one before.   Later admits the pain started after intercourse. States it was "regular missionary style" but pain began with that. Worried she damaged something inside.   ED NOTE FROM DR MOLPUS: CSN: [id number] Arrival date & time 08/31/13 0155  History   First MD Initiated Contact with Patient 08/31/13 0203  Chief Complaint   Patient presents with   .  Abdominal Pain   (Consider location/radiation/quality/duration/timing/severity/associated sxs/prior  Treatment)  HPI  This is a 32 year old female on high dose daily narcotics for chronic pain. She is here with abdominal pain since 12:30 this morning. The pain is in the right suprapubic region. It is sharp and severe. There is no associated fever, chills, nausea, vomiting, diarrhea, constipation, hematuria, dysuria, abdominal distention or vaginal bleeding. She does have some vaginal discharge. It is worse with movement or palpation. She took about 6 to known tablet prior to arrival without relief. She is unsure of her last menstrual period as she has been on Depo-Provera shots but failed to get her most recent to push a year ago.  OB History   Grav Para Term Preterm Abortions TAB SAB Ect Mult Living   2 2 2              Past Medical History  Diagnosis Date  . Back pain   . Anxiety   . Ovarian cyst   . Migraine   . Seizures     after taking zoloft    Past Surgical History  Procedure Laterality Date  . Ovarian cyst  removal      History reviewed. No pertinent family history.  History  Substance Use Topics  . Smoking status: Current Every Day Smoker  . Smokeless tobacco: Not on file  . Alcohol Use: No    Allergies:  Allergies  Allergen Reactions  . Zoloft [Sertraline Hcl] Other (See Comments)    Seizure    Prescriptions prior to admission  Medication Sig Dispense Refill  . diazepam (VALIUM) 10 MG tablet Take 10 mg by mouth every 6 (six) hours as needed. For anxiety.      Marland Kitchen oxyCODONE (OXYCONTIN) 40 MG 12 hr tablet Take 40 mg by mouth every 12 (twelve) hours.      Marland Kitchen oxycodone (ROXICODONE) 30 MG immediate release tablet Take 30 mg by mouth every 4 (four) hours as needed. For pain.      . cyclobenzaprine (FLEXERIL) 10 MG tablet Take 10 mg by mouth 3 (three) times daily as needed. For spasms.      . medroxyPROGESTERone (DEPO-PROVERA) 150 MG/ML injection Inject 150 mg into the muscle every 3 (three) months.        Marland Kitchen oxymorphone (OPANA ER) 30 MG 12 hr tablet Take 30 mg by mouth every 12 (twelve) hours. For back pain.        Review of Systems  Constitutional: Negative for fever, chills and malaise/fatigue.  Gastrointestinal: Positive for  abdominal pain. Negative for nausea, vomiting, diarrhea and constipation (states had BM today).  Genitourinary: Negative for dysuria.  Neurological: Negative for dizziness.   Physical Exam   Blood pressure 109/66, pulse 96, temperature 97.8 F (36.6 C), temperature source Oral, resp. rate 22.  Physical Exam  Constitutional: She is oriented to person, place, and time. She appears well-developed and well-nourished. No distress.  HENT:  Head: Normocephalic.  Cardiovascular: Normal rate.   Respiratory: Effort normal.  GI: Soft. She exhibits no distension. There is tenderness. There is guarding.  Genitourinary:  Pelvic exam done by Dr Read Drivers.  Cultures sent  Musculoskeletal: Normal range of motion.  Neurological: She is alert and oriented to person, place,  and time.  Skin: Skin is warm and dry.  Psychiatric: She has a normal mood and affect.   Results for orders placed during the hospital encounter of 08/31/13 (from the past 24 hour(s))  URINALYSIS, ROUTINE W REFLEX MICROSCOPIC     Status: None   Collection Time    08/31/13  2:05 AM      Result Value Range   Color, Urine YELLOW  YELLOW   APPearance CLEAR  CLEAR   Specific Gravity, Urine 1.017  1.005 - 1.030   pH 6.5  5.0 - 8.0   Glucose, UA NEGATIVE  NEGATIVE mg/dL   Hgb urine dipstick NEGATIVE  NEGATIVE   Bilirubin Urine NEGATIVE  NEGATIVE   Ketones, ur NEGATIVE  NEGATIVE mg/dL   Protein, ur NEGATIVE  NEGATIVE mg/dL   Urobilinogen, UA 1.0  0.0 - 1.0 mg/dL   Nitrite NEGATIVE  NEGATIVE   Leukocytes, UA NEGATIVE  NEGATIVE  PREGNANCY, URINE     Status: None   Collection Time    08/31/13  2:05 AM      Result Value Range   Preg Test, Ur NEGATIVE  NEGATIVE  WET PREP, GENITAL     Status: Abnormal   Collection Time    08/31/13  2:20 AM      Result Value Range   Yeast Wet Prep HPF POC NONE SEEN  NONE SEEN   Trich, Wet Prep NONE SEEN  NONE SEEN   Clue Cells Wet Prep HPF POC NONE SEEN  NONE SEEN   WBC, Wet Prep HPF POC FEW (*) NONE SEEN   CMET normal US Transvaginal Non-ob  08/31/2013   *RADIOLOGY REPORT*  Clinical Data:  Right-sided pelvic pain.  TRANSABDOMINAL AND TRANSVAGINAL ULTRASOUND OF PELVIS DOPPLER ULTRASOUND OF OVARIES  Technique:  Both transabdominal and transvaginal ultrasound examinations of the pelvis were performed. Transabdominal technique was performed for global imaging of the pelvis including uterus, ovaries, adnexal regions, and pelvic cul-de-sac.  It was necessary to proceed with endovaginal exam following the transabdominal exam to visualize the ovaries.  Color and duplex Doppler ultrasound was utilized to evaluate blood flow to the ovaries.  Comparison:  CT abdomen and pelvis 03/21/2011  FINDINGS  Uterus:  The uterus is anteverted and measures 7.9 x 3.6 x 4.4 cm. No  focal myometrial mass lesions demonstrated.  Endometrium:  Normal endometrial stripe thickness is 6.9 mm.  Right ovary: Right ovary measures 3.3 x 1.6 x 2 cm.  Normal follicular changes.  No abnormal adnexal masses.  Flow is demonstrated in the right ovary on color flow Doppler imaging.  Left ovary: Left ovary measures 2.1 x 1.5 x 1.6 cm.  Normal follicular changes.  Flow is demonstrated in the left ovary on color flow Doppler imaging.  No free pelvic fluid collections.  Pulsed Doppler  evaluation demonstrates normal low-resistance arterial and venous waveforms in both ovaries.  IMPRESSION:  Normal exam.  No evidence of pelvic mass or other significant abnormality.  No sonographic evidence for ovarian torsion.   Original Report Authenticated By: Burman Nieves, M.D.   US Pelvis Complete  08/31/2013   *RADIOLOGY REPORT*  Clinical Data:  Right-sided pelvic pain.  TRANSABDOMINAL AND TRANSVAGINAL ULTRASOUND OF PELVIS DOPPLER ULTRASOUND OF OVARIES  Technique:  Both transabdominal and transvaginal ultrasound examinations of the pelvis were performed. Transabdominal technique was performed for global imaging of the pelvis including uterus, ovaries, adnexal regions, and pelvic cul-de-sac.  It was necessary to proceed with endovaginal exam following the transabdominal exam to visualize the ovaries.  Color and duplex Doppler ultrasound was utilized to evaluate blood flow to the ovaries.  Comparison:  CT abdomen and pelvis 03/21/2011  FINDINGS  Uterus:  The uterus is anteverted and measures 7.9 x 3.6 x 4.4 cm. No focal myometrial mass lesions demonstrated.  Endometrium:  Normal endometrial stripe thickness is 6.9 mm.  Right ovary: Right ovary measures 3.3 x 1.6 x 2 cm.  Normal follicular changes.  No abnormal adnexal masses.  Flow is demonstrated in the right ovary on color flow Doppler imaging.  Left ovary: Left ovary measures 2.1 x 1.5 x 1.6 cm.  Normal follicular changes.  Flow is demonstrated in the left ovary on color  flow Doppler imaging.  No free pelvic fluid collections.  Pulsed Doppler evaluation demonstrates normal low-resistance arterial and venous waveforms in both ovaries.  IMPRESSION:  Normal exam.  No evidence of pelvic mass or other significant abnormality.  No sonographic evidence for ovarian torsion.   Original Report Authenticated By: Burman Nieves, M.D.   [place] Art/ven Flow Abd Pelv Doppler  08/31/2013   *RADIOLOGY REPORT*  Clinical Data:  Right-sided pelvic pain.  TRANSABDOMINAL AND TRANSVAGINAL ULTRASOUND OF PELVIS DOPPLER ULTRASOUND OF OVARIES  Technique:  Both transabdominal and transvaginal ultrasound examinations of the pelvis were performed. Transabdominal technique was performed for global imaging of the pelvis including uterus, ovaries, adnexal regions, and pelvic cul-de-sac.  It was necessary to proceed with endovaginal exam following the transabdominal exam to visualize the ovaries.  Color and duplex Doppler ultrasound was utilized to evaluate blood flow to the ovaries.  Comparison:  CT abdomen and pelvis 03/21/2011  FINDINGS  Uterus:  The uterus is anteverted and measures 7.9 x 3.6 x 4.4 cm. No focal myometrial mass lesions demonstrated.  Endometrium:  Normal endometrial stripe thickness is 6.9 mm.  Right ovary: Right ovary measures 3.3 x 1.6 x 2 cm.  Normal follicular changes.  No abnormal adnexal masses.  Flow is demonstrated in the right ovary on color flow Doppler imaging.  Left ovary: Left ovary measures 2.1 x 1.5 x 1.6 cm.  Normal follicular changes.  Flow is demonstrated in the left ovary on color flow Doppler imaging.  No free pelvic fluid collections.  Pulsed Doppler evaluation demonstrates normal low-resistance arterial and venous waveforms in both ovaries.  IMPRESSION:  Normal exam.  No evidence of pelvic mass or other significant abnormality.  No sonographic evidence for ovarian torsion.   Original Report Authenticated By: Burman Nieves, M.D.    MAU Course   Procedures  MDM Discussed with Dr Shawnie Pons Will refer back to her primary doctor for eval. Pt wants eval by our GYN MDs re:  Exploratory surgery for endometriosis  Assessment and Plan  A:  Pelvic/abdominal pain      Chronic opiate use  No evidence of acute abnormality  P:  Discharge home      Has pain meds at home      Call her doctor today       Pelvic rest      Will refer to GYN clinic  [facility] 08/31/2013, 4:44 AM

## 2013-10-12 ENCOUNTER — Encounter: Payer: Medicaid Other | Admitting: Obstetrics & Gynecology

## 2013-10-29 IMAGING — CR DG CHEST 2V
2 series · 2 of 2 positions shown · non-contrast
Comparison: 03/04/2011

CLINICAL DATA: Nausea, headache.  Mid chest pain.  History of
migraine., smoking.

CHEST - 2 VIEW

[w chest pa]
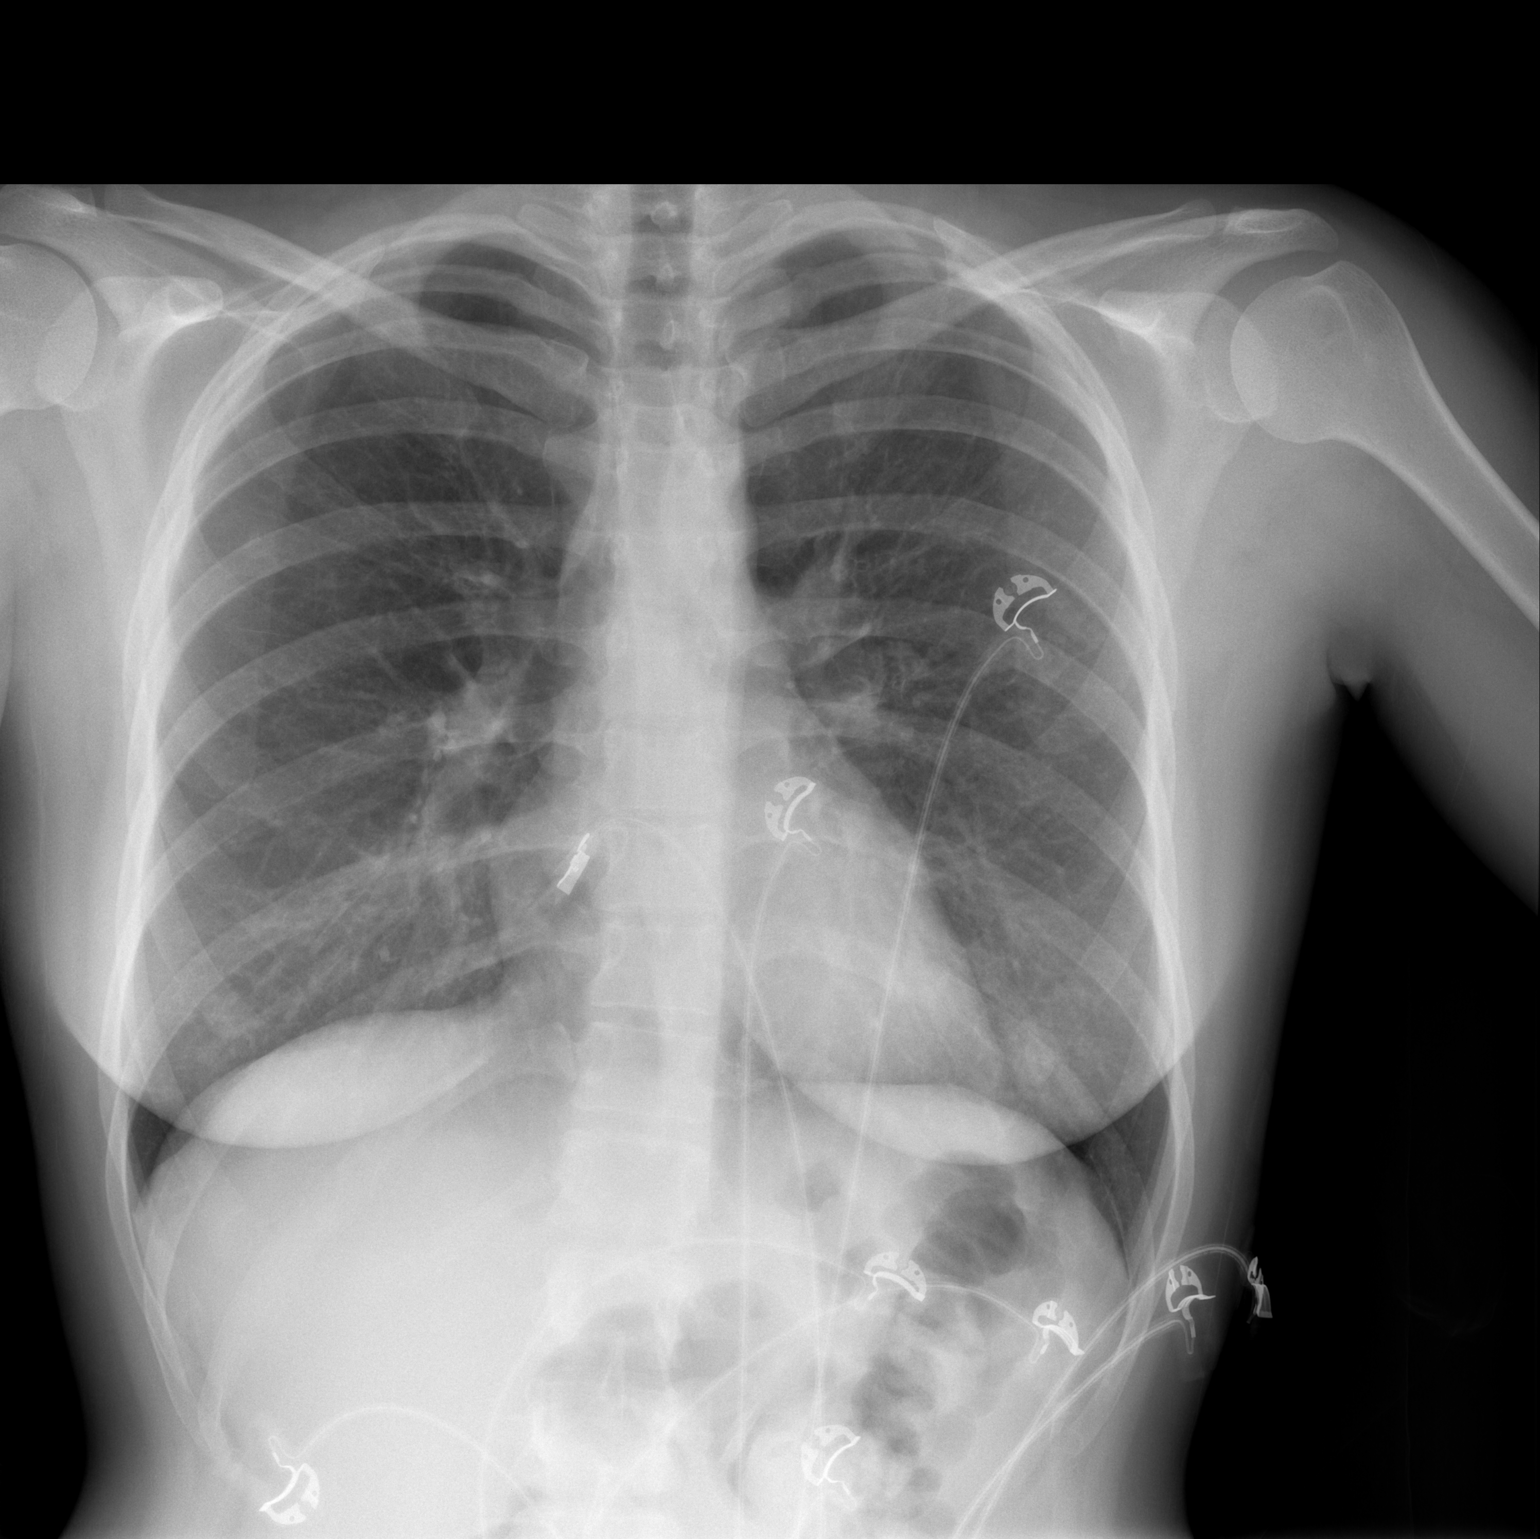

[w chest lat]
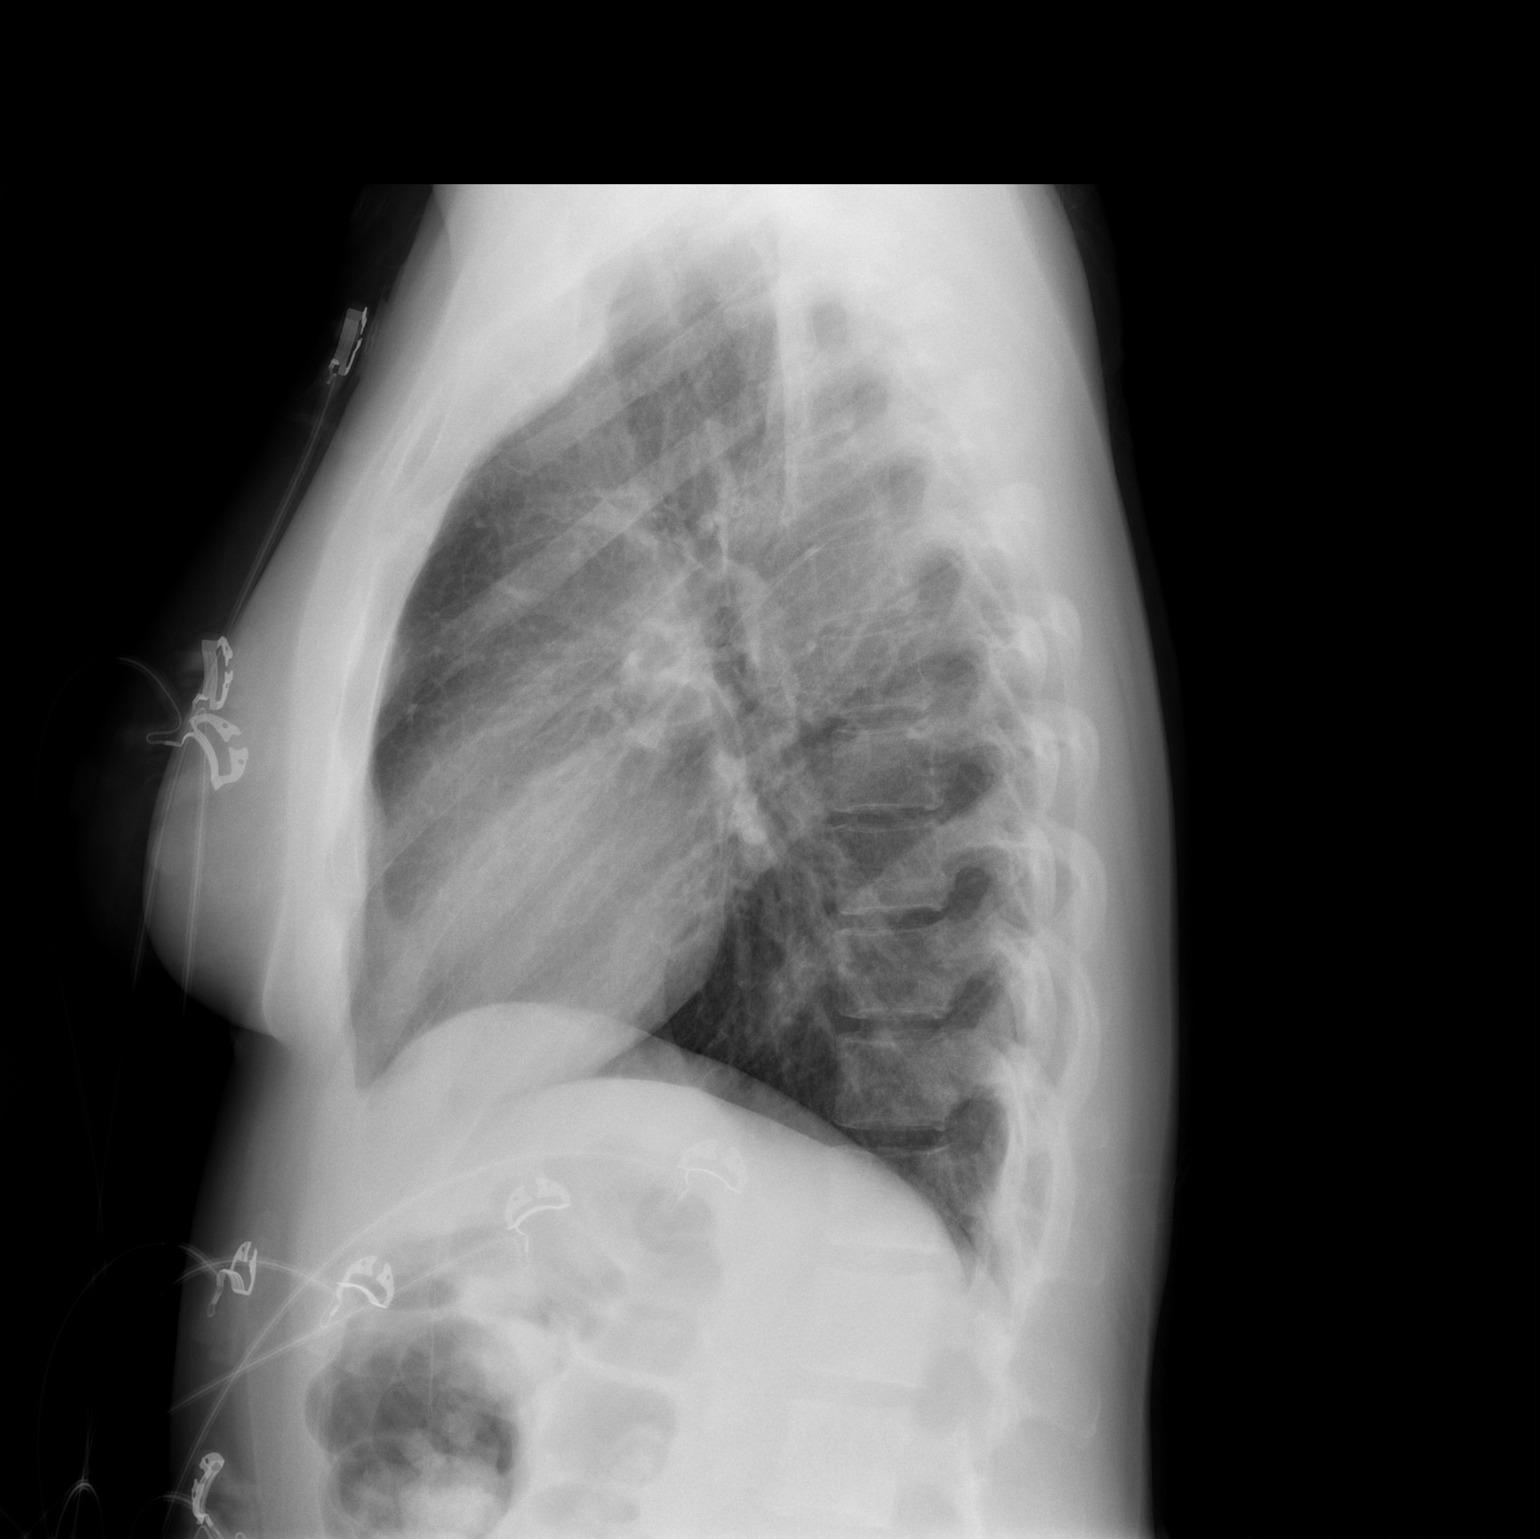

[2 of 2 positions shown; findings below may reference images not displayed]

FINDINGS: Cardiomediastinal silhouette is within normal limits.
The lungs are free of focal consolidations and pleural effusions.
Mildly prominent bronchitic changes appear stable.
IMPRESSION: No evidence for acute cardiopulmonary abnormality.

## 2014-09-12 ENCOUNTER — Emergency Department (HOSPITAL_BASED_OUTPATIENT_CLINIC_OR_DEPARTMENT_OTHER)
Admission: EM | Admit: 2014-09-12 | Discharge: 2014-09-13 | Disposition: A | Discharge status: Home / Self Care | Payer: Medicaid Other | Attending: Emergency Medicine | Admitting: Emergency Medicine

## 2014-09-12 ENCOUNTER — Encounter (HOSPITAL_BASED_OUTPATIENT_CLINIC_OR_DEPARTMENT_OTHER): Payer: Self-pay | Admitting: Emergency Medicine

## 2014-09-12 DIAGNOSIS — G40909 Epilepsy, unspecified, not intractable, without status epilepticus: Secondary | ICD-10-CM | POA: Insufficient documentation

## 2014-09-12 DIAGNOSIS — Z3202 Encounter for pregnancy test, result negative: Secondary | ICD-10-CM | POA: Diagnosis not present

## 2014-09-12 DIAGNOSIS — W57XXXA Bitten or stung by nonvenomous insect and other nonvenomous arthropods, initial encounter: Principal | ICD-10-CM

## 2014-09-12 DIAGNOSIS — S80869A Insect bite (nonvenomous), unspecified lower leg, initial encounter: Principal | ICD-10-CM

## 2014-09-12 DIAGNOSIS — Y9389 Activity, other specified: Secondary | ICD-10-CM | POA: Insufficient documentation

## 2014-09-12 DIAGNOSIS — G43909 Migraine, unspecified, not intractable, without status migrainosus: Secondary | ICD-10-CM | POA: Insufficient documentation

## 2014-09-12 DIAGNOSIS — R51 Headache: Secondary | ICD-10-CM | POA: Insufficient documentation

## 2014-09-12 DIAGNOSIS — IMO0001 Reserved for inherently not codable concepts without codable children: Secondary | ICD-10-CM | POA: Diagnosis not present

## 2014-09-12 DIAGNOSIS — Z8742 Personal history of other diseases of the female genital tract: Secondary | ICD-10-CM | POA: Insufficient documentation

## 2014-09-12 DIAGNOSIS — R21 Rash and other nonspecific skin eruption: Secondary | ICD-10-CM

## 2014-09-12 DIAGNOSIS — F17200 Nicotine dependence, unspecified, uncomplicated: Secondary | ICD-10-CM | POA: Insufficient documentation

## 2014-09-12 DIAGNOSIS — Z79899 Other long term (current) drug therapy: Secondary | ICD-10-CM | POA: Diagnosis not present

## 2014-09-12 DIAGNOSIS — L089 Local infection of the skin and subcutaneous tissue, unspecified: Secondary | ICD-10-CM | POA: Diagnosis present

## 2014-09-12 DIAGNOSIS — M791 Myalgia, unspecified site: Secondary | ICD-10-CM

## 2014-09-12 DIAGNOSIS — W5581XA Bitten by other mammals, initial encounter: Secondary | ICD-10-CM

## 2014-09-12 DIAGNOSIS — F411 Generalized anxiety disorder: Secondary | ICD-10-CM | POA: Insufficient documentation

## 2014-09-12 DIAGNOSIS — Y9289 Other specified places as the place of occurrence of the external cause: Secondary | ICD-10-CM | POA: Diagnosis not present

## 2014-09-12 DIAGNOSIS — T148 Other injury of unspecified body region: Secondary | ICD-10-CM | POA: Diagnosis not present

## 2014-09-12 LAB — BASIC METABOLIC PANEL
ANION GAP: 12 (ref 5–15)
BUN: 10 mg/dL (ref 6–23)
CALCIUM: 9.6 mg/dL (ref 8.4–10.5)
CO2: 26 mEq/L (ref 19–32)
Chloride: 104 mEq/L (ref 96–112)
Creatinine, Ser: 0.7 mg/dL (ref 0.50–1.10)
GFR calc Af Amer: >90 mL/min (ref >90)
Glucose, Bld: 96 mg/dL (ref 70–99)
Potassium: 4.2 mEq/L (ref 3.7–5.3)
SODIUM: 142 meq/L (ref 137–147)

## 2014-09-12 LAB — CBC WITH DIFFERENTIAL/PLATELET
BASOS ABS: 0 10*3/uL (ref 0.0–0.1)
Basophils Relative: 0 % (ref 0–1)
EOS ABS: 0.4 10*3/uL (ref 0.0–0.7)
EOS PCT: 4 % (ref 0–5)
HCT: 41.7 % (ref 36.0–46.0)
Hemoglobin: 14.2 g/dL (ref 12.0–15.0)
Lymphocytes Relative: 40 % (ref 12–46)
Lymphs Abs: 4.4 10*3/uL — ABNORMAL HIGH (ref 0.7–4.0)
MCH: 31.9 pg (ref 26.0–34.0)
MCHC: 34.1 g/dL (ref 30.0–36.0)
MCV: 93.7 fL (ref 78.0–100.0)
Monocytes Absolute: 1 10*3/uL (ref 0.1–1.0)
Monocytes Relative: 9 % (ref 3–12)
Neutro Abs: 5.3 10*3/uL (ref 1.7–7.7)
Neutrophils Relative %: 47 % (ref 43–77)
PLATELETS: 360 10*3/uL (ref 150–400)
RBC: 4.45 MIL/uL (ref 3.87–5.11)
RDW: 13.1 % (ref 11.5–15.5)
WBC: 11.1 10*3/uL — AB (ref 4.0–10.5)

## 2014-09-12 LAB — SEDIMENTATION RATE: SED RATE: 7 mm/h (ref 0–22)

## 2014-09-12 MED ORDER — LIDOCAINE-EPINEPHRINE 2 %-1:100000 IJ SOLN
20.0000 mL | Freq: Once | INTRAMUSCULAR | Status: AC
  Administered 2014-09-12: 1 mL via INTRADERMAL
  Filled 2014-09-12: qty 1

## 2014-09-12 MED ORDER — KETOROLAC TROMETHAMINE 60 MG/2ML IM SOLN
30.0000 mg | Freq: Once | INTRAMUSCULAR | Status: AC
  Administered 2014-09-12: 30 mg via INTRAMUSCULAR
  Filled 2014-09-12: qty 2

## 2014-09-12 NOTE — ED Notes (Signed)
Reports she removed a tick Thursday am- has been feeling fatigued and having headaches since then

## 2014-09-12 NOTE — ED Provider Notes (Signed)
CSN: [id number]     Arrival date & time 09/12/14  0865 History  This chart was scribed for Cassandra Mo, MD by Roxy Cedar, ED Scribe. This patient was seen in room MH01/MH01 and the patient's care was started at 9:50 PM.   Chief Complaint  Patient presents with  . Insect Bite   Patient is a 33 y.o. female presenting with animal bite. The history is provided by the patient. No language interpreter was used.  Animal Bite Contact animal:  Insect Location:  Pelvis Pelvic injury location:  R hip Time since incident:  5 days Pain details:    Quality:  Localized   Severity:  Moderate   Timing:  Constant   Progression:  Unchanged Incident location:  Unable to specify Associated symptoms: rash    HPI Comments: Cassandra Roth is a 33 y.o. female who presents to the Emergency Department complaining of a tick bite to her right hip and states that she removed it 5 days ago. Patient states that she noticed redness in the area after removing it and she felt a knot develop 2 days after she removed it. Patient states that she felt an associated headache and felt lethargic and "really crappy" today with myalgias. Patient denies being in the woods or anywhere in the [place] of [place] or out of the state. Patient states that she has been taking ibuprofen every few hours as needed. Patient denies associated nausea or vomiting. Pt states that the rash is painful, worsened by palpation and movement.    Past Medical History  Diagnosis Date  . Back pain   . Anxiety   . Ovarian cyst   . Migraine   . Seizures     after taking zoloft   Past Surgical History  Procedure Laterality Date  . Ovarian cyst removal     No family history on file. History  Substance Use Topics  . Smoking status: Current Every Day Smoker    Types: Cigarettes  . Smokeless tobacco: Never Used  . Alcohol Use: No   OB History   Grav Para Term Preterm Abortions TAB SAB Ect Mult Living   Review of Systems  Gastrointestinal: Negative for nausea and vomiting.  Skin: Positive for rash.  Neurological: Positive for headaches.  All other systems reviewed and are negative.  Allergies  Zoloft  Home Medications   Prior to Admission medications   Medication Sig Start Date End Date Taking? Authorizing Provider  methadone (DOLOPHINE) 10 MG tablet Take 70 mg by mouth daily.   Yes Historical Provider, MD  cyclobenzaprine (FLEXERIL) 10 MG tablet Take 10 mg by mouth 3 (three) times daily as needed. For spasms.    Historical Provider, MD  diazepam (VALIUM) 10 MG tablet Take 10 mg by mouth every 6 (six) hours as needed. For anxiety.    Historical Provider, MD  medroxyPROGESTERone (DEPO-PROVERA) 150 MG/ML injection Inject 150 mg into the muscle every 3 (three) months.      Historical Provider, MD  oxyCODONE (OXYCONTIN) 40 MG 12 hr tablet Take 40 mg by mouth every 12 (twelve) hours.    Historical Provider, MD  oxycodone (ROXICODONE) 30 MG immediate release tablet Take 30 mg by mouth every 4 (four) hours as needed. For pain.    Historical Provider, MD   Triage Vitals: BP 133/64  Pulse 64  Temp(Src) 98.3 F (36.8 C) (Oral)  Resp 18  SpO2  99%  LMP 08/12/2014  Physical Exam  Nursing note and vitals reviewed. Constitutional: She is oriented to person, place, and time. She appears well-developed and well-nourished. No distress.  HENT:  Head: Normocephalic and atraumatic.  Eyes: Conjunctivae and EOM are normal.  Neck: Neck supple. No tracheal deviation present.  Cardiovascular: Normal rate.   Pulmonary/Chest: Effort normal. No respiratory distress.  Musculoskeletal: Normal range of motion.  Neurological: She is alert and oriented to person, place, and time.  Skin: Skin is warm and dry. Rash noted. There is erythema.  Erythematous 4 cm rash over R lateral thigh with pustular head, tenderness, and clearing of middle margin  Psychiatric: She has a normal mood and affect. Her behavior  is normal.   ED Course  INCISION AND DRAINAGE Date/Time: 09/13/2014 11:33 AM Performed by: Cassandra Roth Authorized by: Cassandra Roth Consent: Verbal consent obtained. Type: abscess Body area: lower extremity Location details: right hip Anesthesia: local infiltration Local anesthetic: lidocaine 1% with epinephrine Scalpel size: 11 Incision type: single straight Complexity: simple Drainage: bloody Drainage amount: scant Wound treatment: wound left open Patient tolerance: Patient tolerated the procedure well with no immediate complications.   (including critical care time)  DIAGNOSTIC STUDIES: Oxygen Saturation is 99% on RA, normal by my interpretation.    COORDINATION OF CARE: 9:53 PM- Will give patient doxycycline antibiotic medication to treat Lyme Disease and pain management and will discharge patient. Pt advised of plan for treatment and pt agrees.  Labs Review Labs Reviewed - No data to display  Imaging Review No results found.   EKG Interpretation None     MDM   Final diagnoses:  None    33 y.o. female without pertinent PMH presents with rash 5 days after being bitten by a tick.  She brought the tick with her, however it is too dessicated to identify at this time.  No fevers and pt has stable vitals at this time.  Rash appearance could be consistent with EM, however it is painful and there is an area of central purulence and ? fluctuance concerning for possible abscess, so incised area without expression of purulence.  Given that there is no abscess present, will treat empirically for lyme vs cellulitis with doxycycline x 3 weeks.  Pt was given strict return precautions for downstream lyme effects, however at this time is experiencing only early symptoms.  DC home in stable condition.    1. Rash   2. Myalgia       I personally performed the services described in this documentation, which was scribed in my presence. The recorded information has been reviewed  and is accurate.    Cassandra Mo, MD 09/13/14 1136

## 2014-09-13 LAB — C-REACTIVE PROTEIN: CRP: <0.5 mg/dL — ABNORMAL LOW (ref <0.60)

## 2014-09-13 LAB — PREGNANCY, URINE: Preg Test, Ur: NEGATIVE

## 2014-09-13 MED ORDER — DOXYCYCLINE HYCLATE 100 MG PO CAPS: 100.0000 mg | ORAL_CAPSULE | Freq: Two times a day (BID) | ORAL | Status: DC

## 2014-09-13 NOTE — ED Notes (Signed)
Pt discharged to home with family. NAD.  

## 2014-09-13 NOTE — Discharge Instructions (Signed)
Lyme Disease You may have been bitten by a tick and are to watch for the development of Lyme Disease. Lyme Disease is an infection that is caused by a bacteria The bacteria causing this disease is named Borreilia burgdorferi. If a tick is infected with this bacteria and then bites you, then Lyme Disease may occur. These ticks are carried by deer and rodents such as rabbits and mice and infest grassy as well as forested areas. Fortunately most tick bites do not cause Lyme Disease.  Lyme Disease is easier to prevent than to treat. First, covering your legs with clothing when walking in areas where ticks are possibly abundant will prevent their attachment because ticks tend to stay within inches of the ground. Second, using insecticides containing DEET can be applied on skin or clothing. Last, because it takes about 12 to 24 hours for the tick to transmit the disease after attachment to the human host, you should inspect your body for ticks twice a day when you are in areas where Lyme Disease is common. You must look thoroughly when searching for ticks. The Ixodes tick that carries Lyme Disease is very small. It is around the size of a sesame seed (picture of tick is not actual size). Removal is best done by grasping the tick by the head and pulling it out. Do not to squeeze the body of the tick. This could inject the infecting bacteria into the bite site. Wash the area of the bite with an antiseptic solution after removal.  Lyme Disease is a disease that may affect many body systems. Because of the small size of the biting tick, most people do not notice being bitten. The first sign of an infection is usually a round red rash that extends out from the center of the tick bite. The center of the lesion may be blood colored (hemorrhagic) or have tiny blisters (vesicular). Most lesions have bright red outer borders and partial central clearing. This rash may extend out many inches in diameter, and multiple lesions may  be present. Other symptoms such as fatigue, headaches, chills and fever, general achiness and swelling of lymph glands may also occur. If this first stage of the disease is left untreated, these symptoms may gradually resolve by themselves, or progressive symptoms may occur because of spread of infection to other areas of the body.  Follow up with your caregiver to have testing and treatment if you have a tick bite and you develop any of the above complaints. Your caregiver may recommend preventative (prophylactic) medications which kill bacteria (antibiotics). Once a diagnosis of Lyme Disease is made, antibiotic treatment is highly likely to cure the disease. Effective treatment of late stage Lyme Disease may require longer courses of antibiotic therapy.  MAKE SURE YOU:   Understand these instructions.  Will watch your condition.  Will get help right away if you are not doing well or get worse. Document Released: 03/16/2001 Document Revised: 03/01/2012 Document Reviewed: 05/18/2009 Exit[employer] Patient Information 2015 [employer]. This information is not intended to replace advice given to you by your health care provider. Make sure you discuss any questions you have with your health care provider.  

## 2014-09-14 LAB — B. BURGDORFI ANTIBODIES: B burgdorferi Ab IgG+IgM: 0.54 {ISR}

## 2014-10-23 ENCOUNTER — Encounter (HOSPITAL_BASED_OUTPATIENT_CLINIC_OR_DEPARTMENT_OTHER): Payer: Self-pay | Admitting: Emergency Medicine

## 2018-09-17 DIAGNOSIS — F17210 Nicotine dependence, cigarettes, uncomplicated: Secondary | ICD-10-CM | POA: Diagnosis present

## 2018-11-29 DIAGNOSIS — N200 Calculus of kidney: Secondary | ICD-10-CM

## 2021-10-29 ENCOUNTER — Other Ambulatory Visit: Payer: Self-pay | Admitting: Nurse Practitioner

## 2021-10-29 MED ORDER — OSELTAMIVIR PHOSPHATE 75 MG PO CAPS: 75.0000 mg | ORAL_CAPSULE | Freq: Every day | ORAL | 0 refills | Status: DC

## 2021-10-29 NOTE — Progress Notes (Signed)
Patient daughter tested positive for flu A today in office. Mom is [redacted] weeks pregnant. Will prophylactic treat with tamiflu.  Meds ordered this encounter  Medications   oseltamivir (TAMIFLU) 75 MG capsule    Sig: Take 1 capsule (75 mg total) by mouth daily.    Dispense:  10 capsule    Refill:  0    Order Specific Question:   Supervising Provider    Answer:   Nils Pyle [[id number]]   Mary-Margaret Daphine Deutscher, FNP

## 2021-11-08 ENCOUNTER — Encounter (HOSPITAL_BASED_OUTPATIENT_CLINIC_OR_DEPARTMENT_OTHER): Payer: Self-pay

## 2021-11-08 ENCOUNTER — Emergency Department (HOSPITAL_BASED_OUTPATIENT_CLINIC_OR_DEPARTMENT_OTHER): Payer: Medicaid Other

## 2021-11-08 ENCOUNTER — Emergency Department (HOSPITAL_BASED_OUTPATIENT_CLINIC_OR_DEPARTMENT_OTHER)
Admission: EM | Admit: 2021-11-08 | Discharge: 2021-11-08 | Disposition: A | Discharge status: Home / Self Care | Payer: Medicaid Other | Attending: Emergency Medicine | Admitting: Emergency Medicine

## 2021-11-08 ENCOUNTER — Other Ambulatory Visit: Payer: Self-pay

## 2021-11-08 DIAGNOSIS — F17210 Nicotine dependence, cigarettes, uncomplicated: Secondary | ICD-10-CM | POA: Insufficient documentation

## 2021-11-08 DIAGNOSIS — R051 Acute cough: Secondary | ICD-10-CM

## 2021-11-08 DIAGNOSIS — R079 Chest pain, unspecified: Secondary | ICD-10-CM | POA: Insufficient documentation

## 2021-11-08 DIAGNOSIS — R059 Cough, unspecified: Secondary | ICD-10-CM | POA: Diagnosis not present

## 2021-11-08 DIAGNOSIS — Z3491 Encounter for supervision of normal pregnancy, unspecified, first trimester: Secondary | ICD-10-CM

## 2021-11-08 LAB — BASIC METABOLIC PANEL
Anion gap: 10 (ref 5–15)
BUN: 7 mg/dL (ref 6–20)
CO2: 19 mmol/L — ABNORMAL LOW (ref 22–32)
Calcium: 9.7 mg/dL (ref 8.9–10.3)
Chloride: 109 mmol/L (ref 98–111)
Creatinine, Ser: 0.43 mg/dL — ABNORMAL LOW (ref 0.44–1.00)
GFR, Estimated: >60 mL/min (ref >60)
Glucose, Bld: 101 mg/dL — ABNORMAL HIGH (ref 70–99)
Potassium: 3.8 mmol/L (ref 3.5–5.1)
Sodium: 138 mmol/L (ref 135–145)

## 2021-11-08 LAB — TROPONIN I (HIGH SENSITIVITY): Troponin I (High Sensitivity): 2 ng/L (ref <18)

## 2021-11-08 LAB — CBC
HCT: 39.1 % (ref 36.0–46.0)
Hemoglobin: 13.9 g/dL (ref 12.0–15.0)
MCH: 32.6 pg (ref 26.0–34.0)
MCHC: 35.5 g/dL (ref 30.0–36.0)
MCV: 91.8 fL (ref 80.0–100.0)
Platelets: 458 10*3/uL — ABNORMAL HIGH (ref 150–400)
RBC: 4.26 MIL/uL (ref 3.87–5.11)
RDW: 13.4 % (ref 11.5–15.5)
WBC: 19.8 10*3/uL — ABNORMAL HIGH (ref 4.0–10.5)
nRBC: 0 % (ref 0.0–0.2)

## 2021-11-08 LAB — PREGNANCY, URINE: Preg Test, Ur: POSITIVE — AB

## 2021-11-08 MED ORDER — LACTATED RINGERS IV BOLUS
1000.0000 mL | Freq: Once | INTRAVENOUS | Status: AC
  Administered 2021-11-08: 1000 mL via INTRAVENOUS

## 2021-11-08 NOTE — Discharge Instructions (Addendum)
You likely have residual cough after flu infection.  You may use over-the-counter cough medicine.  See your primary care doctor and OB doctor for follow-up  Return to ER if you have worse cough, trouble breathing, fever, vaginal bleeding, normal pain

## 2021-11-08 NOTE — ED Triage Notes (Signed)
Pt arrives ambulatory to ED with reports of being about [redacted] weeks pregnant, states last Wednesday she was diagnosed with the flu, states her symptoms have gotten better other than a deep cough and tightness in her chest. Pt also reports pain to LLQ. CP is central with radiation into back.

## 2021-11-08 NOTE — ED Provider Notes (Signed)
[facility] EMERGENCY DEPARTMENT Provider Note   CSN: [id number] Arrival date & time: 11/08/21  1705     History Chief Complaint  Patient presents with   Chest Pain    Cassandra Roth is a 40 y.o. female about [redacted] weeks pregnant here with cough, chest pain.  Patient was diagnosed with flu last week.  Patient finished a course of Tamiflu.  She states that the fever went away but she still has pain when she coughs.  Patient also has nonproductive cough.  Patient is concerned that she may have pneumonia.  She states that she has not seen an OB doctor yet but denies any vaginal bleeding or discharge.   The history is provided by the patient.      Past Medical History:  Diagnosis Date   Anxiety    Back pain    Migraine    Ovarian cyst    Seizures (HCC)    after taking zoloft    There are no problems to display for this patient.   Past Surgical History:  Procedure Laterality Date   OVARIAN CYST REMOVAL       OB History     Gravida  3   Para  2   Term  2   Preterm      AB      Living         SAB      IAB      Ectopic      Multiple      Live Births              No family history on file.  Social History   Tobacco Use   Smoking status: Every Day    Packs/day: 0.50    Types: Cigarettes   Smokeless tobacco: Never  Vaping Use   Vaping Use: Never used  Substance Use Topics   Alcohol use: No   Drug use: No    Home Medications Prior to Admission medications   Medication Sig Start Date End Date Taking? Authorizing Provider  cyclobenzaprine (FLEXERIL) 10 MG tablet Take 10 mg by mouth 3 (three) times daily as needed. For spasms.    [provider]  diazepam (VALIUM) 10 MG tablet Take 10 mg by mouth every 6 (six) hours as needed. For anxiety.    [provider]  doxycycline (VIBRAMYCIN) 100 MG capsule Take 1 capsule (100 mg total) by mouth 2 (two) times daily. 09/13/14   Mirian Mo, MD  medroxyPROGESTERone  (DEPO-PROVERA) 150 MG/ML injection Inject 150 mg into the muscle every 3 (three) months.      [provider]  methadone (DOLOPHINE) 10 MG tablet Take 70 mg by mouth daily.    [provider]  oseltamivir (TAMIFLU) 75 MG capsule Take 1 capsule (75 mg total) by mouth daily. 10/29/21   Daphine Deutscher, Mary-Margaret, FNP  oxyCODONE (OXYCONTIN) 40 MG 12 hr tablet Take 40 mg by mouth every 12 (twelve) hours.    [provider]  oxycodone (ROXICODONE) 30 MG immediate release tablet Take 30 mg by mouth every 4 (four) hours as needed. For pain.    [provider]    Allergies    Zoloft [sertraline hcl]  Review of Systems   Review of Systems  Respiratory:  Positive for cough.   Cardiovascular:  Positive for chest pain.  All other systems reviewed and are negative.  Physical Exam Updated Vital Signs BP 125/83   Pulse 98   Temp 98.7  F (37.1 C) (Oral)   Resp (!) 21   Ht 5\' 3"  (1.6 m)   Wt 86.2 kg   LMP 07/29/2021   SpO2 94%   BMI 33.66 kg/m   Physical Exam Vitals and nursing note reviewed.  Constitutional:      Comments: Slightly uncomfortable, coughing  HENT:     Head: Normocephalic.  Eyes:     Pupils: Pupils are equal, round, and reactive to light.  Cardiovascular:     Rate and Rhythm: Normal rate and regular rhythm.     Heart sounds: Normal heart sounds.  Pulmonary:     Comments: Slightly tachypneic and diminished bilateral bases Abdominal:     General: Bowel sounds are normal.     Palpations: Abdomen is soft.     Comments: Gravid uterus nontender  Musculoskeletal:        General: Normal range of motion.     Cervical back: Normal range of motion and neck supple.  Skin:    General: Skin is warm.     Capillary Refill: Capillary refill takes less than 2 seconds.  Psychiatric:        Mood and Affect: Mood normal.        Behavior: Behavior normal.    ED Results / Procedures / Treatments   Labs (all labs ordered are listed, but only abnormal  results are displayed) Labs Reviewed  BASIC METABOLIC PANEL - Abnormal; Notable for the following components:      Result Value   CO2 19 (*)    Glucose, Bld 101 (*)    Creatinine, Ser 0.43 (*)    All other components within normal limits  CBC - Abnormal; Notable for the following components:   WBC 19.8 (*)    Platelets 458 (*)    All other components within normal limits  PREGNANCY, URINE - Abnormal; Notable for the following components:   Preg Test, Ur POSITIVE (*)    All other components within normal limits  TROPONIN I (HIGH SENSITIVITY)  TROPONIN I (HIGH SENSITIVITY)    EKG EKG Interpretation  Date/Time:  Friday November 08 2021 17:25:04 EST Ventricular Rate:  109 PR Interval:  138 QRS Duration: 78 QT Interval:  332 QTC Calculation: 447 R Axis:   63 Text Interpretation: Sinus tachycardia Otherwise normal ECG Since last tracing rate faster Confirmed by 06[birth year]1990 [phone]) on 11/08/2021 7:45:06 PM  Radiology DG Chest Port 1 View  Result Date: 11/08/2021 CLINICAL DATA:  Cough. EXAM: PORTABLE CHEST 1 VIEW COMPARISON:  Chest x-ray 07/21/2012. FINDINGS: The heart size and mediastinal contours are within normal limits. Both lungs are clear. The visualized skeletal structures are unremarkable. IMPRESSION: No active disease. Electronically Signed   By: 07/23/2012 M.D.   On: 11/08/2021 19:33    Procedures Procedures   EMERGENCY DEPARTMENT 11/10/2021 PREGNANCY "Study: Limited Ultrasound of the Pelvis for Pregnancy"  INDICATIONS:Pregnancy(required) Multiple views of the uterus and pelvic cavity were obtained in real-time with a multi-frequency probe.  APPROACH:Transabdominal  PERFORMED BY: Myself IMAGES ARCHIVED?: Yes LIMITATIONS: Body habitus PREGNANCY FREE FLUID: None ADNEXAL FINDINGS:Left ovary not seen and Right ovary not seen GESTATIONAL AGE, ESTIMATE: 13 weeks 6 days  FETAL HEART RATE: 176 INTERPRETATION: Intrauterine gestational sac noted     Medications Ordered  in ED Medications  lactated ringers bolus 1,000 mL (1,000 mLs Intravenous New Bag/Given 11/08/21 1946)    ED Course  I have reviewed the triage vital signs and the nursing notes.  Pertinent labs & imaging results that  were available during my care of the patient were reviewed by me and considered in my medical decision making (see chart for details).    MDM Rules/Calculators/A&P                          BRADY PLANT is a 40 y.o. female here presenting with cough.  Patient was initially tachycardic.  However with some fluids it resolved.  Patient had the flu about a week ago and has some chest pain when she coughs.  I have low suspicion for ACS.  I think likely post flu cough versus pneumonia.  Patient states that she is amenable to get a chest x-ray.  I did a bedside ultrasound and I was able to demonstrate a live IUP about 13 weeks and 6 days.  She has no abdominal tenderness on my exam.  8:23 PM Patient's troponin is negative.  Patient's chest ray did not show any pneumonia.  I think likely post flu cough.  At this point, she is stable for discharge.  I do not recommend any antibiotics right now   Final Clinical Impression(s) / ED Diagnoses Final diagnoses:  None    Rx / DC Orders ED Discharge Orders     None        Charlynne Pander, MD 11/08/21 2023

## 2021-11-08 NOTE — ED Notes (Signed)
Per Dr Silverio Lay no delta trop

## 2021-11-08 NOTE — ED Notes (Signed)
Discharge instructions discussed with pt. Pt verbalized understanding with no questions at this time. Pt to go home with s/o at bedside 

## 2021-11-18 ENCOUNTER — Ambulatory Visit (INDEPENDENT_AMBULATORY_CARE_PROVIDER_SITE_OTHER): Payer: Medicaid Other

## 2021-11-18 DIAGNOSIS — O0990 Supervision of high risk pregnancy, unspecified, unspecified trimester: Secondary | ICD-10-CM | POA: Insufficient documentation

## 2021-11-18 DIAGNOSIS — Z3480 Encounter for supervision of other normal pregnancy, unspecified trimester: Secondary | ICD-10-CM

## 2021-11-18 MED ORDER — BLOOD PRESSURE KIT DEVI
1.0000 | 0 refills | Status: AC
Start: 2021-11-18 — End: ?

## 2021-11-18 MED ORDER — GOJJI WEIGHT SCALE MISC
1.0000 | 0 refills | Status: AC
Start: 2021-11-18 — End: ?

## 2021-11-18 NOTE — Progress Notes (Signed)
New OB Intake  I connected with  Cassandra Roth on 11/18/21 at  9:00 AM EST by telephone Video Visit and verified that I am speaking with the correct person using two identifiers. Nurse is located at [facility] and pt is located at Home.  I discussed the limitations, risks, security and privacy concerns of performing an evaluation and management service by telephone and the availability of in person appointments. I also discussed with the patient that there may be a patient responsible charge related to this service. The patient expressed understanding and agreed to proceed.  I explained I am completing New OB Intake today. We discussed her EDD of 05/05/2022 that is based on LMP of 07/29/21. Pt is G3P2002. I reviewed her allergies, medications, Medical/Surgical/OB history, and appropriate screenings. I informed her of [facility] services. Based on history, this is a/an  pregnancy uncomplicated .   There are no problems to display for this patient.   Concerns addressed today  Delivery Plans:  Plans to deliver at [facility].   MyChart/Babyscripts MyChart access verified. I explained pt will have some visits in office and some virtually. Babyscripts instructions given and order placed. Patient verifies receipt of registration text/e-mail. Account successfully created and app downloaded.  Blood Pressure Cuff  Blood pressure cuff ordered for patient to pick-up from [employer]. Explained after first prenatal appt pt will check weekly and document in Babyscripts.  Weight scale: Patient does not have weight scale. Weight scale ordered for patient to pick up form [facility].   Anatomy US Explained first scheduled [place] will be around 19 weeks. Anatomy US scheduled will be scheduled at her NEW OB visit tomorrow.   Labs Discussed Avelina Laine genetic screening with patient. Would like both Panorama and Horizon drawn at new OB visit. Routine prenatal labs needed.  Covid Vaccine Patient has not covid vaccine.    Informed patient of [facility] website  and placed link in her AVS.   Social Determinants of Health Food Insecurity: Patient denies food insecurity. WIC Referral: Patient is interested in referral to [facility].  Transportation: Patient denies transportation needs. Childcare: Discussed no children allowed at ultrasound appointments. Offered childcare services; patient declines childcare services at this time.  Send link to Pregnancy Navigators   Placed OB Box on problem list and updated  First visit review I reviewed new OB appt with pt. I explained she will have a pelvic exam, ob bloodwork with genetic screening, and PAP smear. Explained pt will be seen by Warner Mccreedy at first visit; encounter routed to appropriate provider. Explained that patient will be seen by pregnancy navigator following visit with provider. [facility] information placed in AVS.   Hamilton Capri, RN 11/18/2021  9:20 AM

## 2021-11-18 NOTE — Progress Notes (Signed)
Patient was assessed and managed by nursing staff during this encounter. I have reviewed the chart and agree with the documentation and plan. I have also made any necessary editorial changes.  Catalina Antigua, MD 11/18/2021 11:51 AM

## 2021-11-19 ENCOUNTER — Ambulatory Visit (INDEPENDENT_AMBULATORY_CARE_PROVIDER_SITE_OTHER): Payer: Medicaid Other | Admitting: Family Medicine

## 2021-11-19 ENCOUNTER — Other Ambulatory Visit: Payer: Self-pay

## 2021-11-19 ENCOUNTER — Other Ambulatory Visit (HOSPITAL_COMMUNITY)
Admission: RE | Admit: 2021-11-19 | Discharge: 2021-11-19 | Disposition: A | Discharge status: Home / Self Care | Payer: Medicaid Other | Source: Ambulatory Visit | Attending: Family Medicine | Admitting: Family Medicine

## 2021-11-19 ENCOUNTER — Encounter: Payer: Self-pay | Admitting: Obstetrics and Gynecology

## 2021-11-19 VITALS — BP 118/76 | HR 93 | Wt 193.0 lb

## 2021-11-19 DIAGNOSIS — O09529 Supervision of elderly multigravida, unspecified trimester: Secondary | ICD-10-CM

## 2021-11-19 DIAGNOSIS — Z3A16 16 weeks gestation of pregnancy: Secondary | ICD-10-CM

## 2021-11-19 DIAGNOSIS — Z3480 Encounter for supervision of other normal pregnancy, unspecified trimester: Secondary | ICD-10-CM | POA: Insufficient documentation

## 2021-11-19 DIAGNOSIS — O09522 Supervision of elderly multigravida, second trimester: Secondary | ICD-10-CM

## 2021-11-19 LAB — HEPATITIS C ANTIBODY: HCV Ab: NEGATIVE

## 2021-11-19 MED ORDER — ASPIRIN EC 81 MG PO TBEC: 81.0000 mg | DELAYED_RELEASE_TABLET | Freq: Every day | ORAL | 4 refills | Status: DC

## 2021-11-19 NOTE — Progress Notes (Signed)
Subjective:   Cassandra Roth is a 40 y.o. G3P2002 at [redacted]w[redacted]d by LMP being seen today for her first obstetrical visit.  Her obstetrical history is significant for advanced maternal age and obesity. Patient does not intend to breast feed. Pregnancy history fully reviewed.  Patient reports no complaints.  HISTORY: OB History  Gravida Para Term Preterm AB Living  $Remov'3 2 2 'dryWWY$ 0 0 2  SAB IAB Ectopic Multiple Live Births  0 0 0 0 2    # Outcome Date GA Lbr Len/2nd Weight Sex Delivery Anes PTL Lv  3 Current           2 Term 07/08/07    F Vag-Spont   LIV  1 Term 07/22/99    F Vag-Spont   LIV   Last pap smear was  11/29/2018 (in [place]- done at [place]) and was normal with negative HPV Past Medical History:  Diagnosis Date   Anxiety    Back pain    Migraine    Ovarian cyst    Seizures ([place])    after taking zoloft   Past Surgical History:  Procedure Laterality Date   OVARIAN CYST REMOVAL     Family History  Problem Relation Age of Onset   Hypertension Mother    Diabetes Mother    Kidney cancer Mother    Throat cancer Maternal Grandmother    Dementia Maternal Grandmother    Social History   Tobacco Use   Smoking status: Every Day    Packs/day: 0.50    Types: Cigarettes   Smokeless tobacco: Never  Vaping Use   Vaping Use: Never used  Substance Use Topics   Alcohol use: Not Currently    Comment: not since confirmed pregnancy   Drug use: Yes    Types: Other-see comments    Comment: stopped methadone 3 weeks ago   Allergies  Allergen Reactions   Zoloft [Sertraline Hcl] Other (See Comments)    Seizure   Current Outpatient Medications on File Prior to Visit  Medication Sig Dispense Refill   Blood Pressure Monitoring (BLOOD PRESSURE KIT) DEVI 1 kit by Does not apply route once a week. 1 each 0   methadone (DOLOPHINE) 10 MG tablet Take 70 mg by mouth daily. (Patient not taking: Reported on 11/18/2021)     Misc. Devices (GOJJI WEIGHT SCALE) MISC 1 Device by Does not  apply route every 30 (thirty) days. 1 each 0   Prenatal Vit-Fe Fumarate-FA (PRENATAL PLUS VITAMIN/MINERAL) 27-1 MG TABS Take 1 tablet by mouth daily.     No current facility-administered medications on file prior to visit.     Exam   Vitals:   11/19/21 1026  BP: 118/76  Pulse: 93  Weight: 193 lb (87.5 kg)   Fetal Heart Rate (bpm): 158  Uterus:     Pelvic Exam: Perineum: no hemorrhoids, normal perineum   Vulva: normal external genitalia, no lesions   Vagina:  normal mucosa, normal discharge   Adnexa: normal adnexa and no mass, fullness, tenderness   Bony Pelvis: average  System: General: well-developed, well-nourished female in no acute distress   Neurologic: oriented, normal, negative, normal mood   Extremities: normal strength, tone, and muscle mass, ROM of all joints is normal   HEENT extraocular movement intact and sclera clear, anicteric   Respiratory:  no respiratory distress, breathing comfortably on room air   Abdomen: soft, non-tender; bowel sounds normal; no masses,  no organomegaly     Assessment:   Pregnancy:  [id number] Patient Active Problem List   Diagnosis Date Noted   Advanced maternal age in multigravida 11/19/2021   Supervision of other normal pregnancy, antepartum 11/18/2021     Plan:  1. Supervision of other normal pregnancy, antepartum 2. [redacted] weeks gestation of pregnancy Here for initial OB. Doing well. Taking PNV. No acute concerns at this time.Up to date with PAP.  - Cervicovaginal ancillary only( ) - Genetic Screening - Culture, OB Urine - Obstetric Panel, Including HIV - Hepatitis C Antibody - [place] MFM OB DETAIL +14 WK; Future    3. Antepartum multigravida of advanced maternal age Age >35 and BMI 53, meets criteria to start ASA for pre-eclampsia PPX - sent to patient's pharmacy   Initial labs drawn. Continue prenatal vitamins. Genetic Screening discussed, First trimester screen, Quad screen, and NIPS: requested. Ultrasound  discussed; fetal anatomic survey: requested. Problem list reviewed and updated. The nature of [facility] with multiple MDs and other Advanced Practice Providers was explained to patient; also emphasized that residents, students are part of our team. Routine obstetric precautions reviewed. Return in about 4 weeks (around 12/17/2021) for HROB, any provider.  Renard Matter, MD, MPH OB Fellow, Faculty Practice

## 2021-11-19 NOTE — Progress Notes (Signed)
NEW OB, reports no problems today.

## 2021-11-20 LAB — HEPATITIS C ANTIBODY: Hep C Virus Ab: <0.1 s/co ratio (ref 0.0–0.9)

## 2021-11-20 LAB — CERVICOVAGINAL ANCILLARY ONLY
Bacterial Vaginitis (gardnerella): NEGATIVE
Candida Glabrata: NEGATIVE
Candida Vaginitis: NEGATIVE
Chlamydia: NEGATIVE
Comment: NEGATIVE
Comment: NEGATIVE
Comment: NEGATIVE
Comment: NEGATIVE
Comment: NORMAL
Neisseria Gonorrhea: NEGATIVE

## 2021-11-20 LAB — OBSTETRIC PANEL, INCLUDING HIV
Antibody Screen: NEGATIVE
Basophils Absolute: 0 10*3/uL (ref 0.0–0.2)
Basos: 0 %
EOS (ABSOLUTE): 0.2 10*3/uL (ref 0.0–0.4)
Eos: 1 %
HIV Screen 4th Generation wRfx: NONREACTIVE
Hematocrit: 38.8 % (ref 34.0–46.6)
Hemoglobin: 13.4 g/dL (ref 11.1–15.9)
Hepatitis B Surface Ag: NEGATIVE
Immature Grans (Abs): 0.1 10*3/uL (ref 0.0–0.1)
Immature Granulocytes: 1 %
Lymphocytes Absolute: 3 10*3/uL (ref 0.7–3.1)
Lymphs: 17 %
MCH: 31.9 pg (ref 26.6–33.0)
MCHC: 34.5 g/dL (ref 31.5–35.7)
MCV: 92 fL (ref 79–97)
Monocytes Absolute: 1 10*3/uL — ABNORMAL HIGH (ref 0.1–0.9)
Monocytes: 6 %
Neutrophils Absolute: 13.7 10*3/uL — ABNORMAL HIGH (ref 1.4–7.0)
Neutrophils: 75 %
Platelets: 465 10*3/uL — ABNORMAL HIGH (ref 150–450)
RBC: 4.2 x10E6/uL (ref 3.77–5.28)
RDW: 13.2 % (ref 11.7–15.4)
RPR Ser Ql: NONREACTIVE
Rh Factor: NEGATIVE
Rubella Antibodies, IGG: 1.31 index (ref >0.99)
WBC: 18.1 10*3/uL — ABNORMAL HIGH (ref 3.4–10.8)

## 2021-11-21 LAB — CULTURE, OB URINE

## 2021-11-21 LAB — URINE CULTURE, OB REFLEX

## 2021-11-23 ENCOUNTER — Other Ambulatory Visit: Payer: Self-pay | Admitting: Family Medicine

## 2021-11-23 DIAGNOSIS — Z3480 Encounter for supervision of other normal pregnancy, unspecified trimester: Secondary | ICD-10-CM

## 2021-11-26 ENCOUNTER — Encounter: Payer: Self-pay | Admitting: Family Medicine

## 2021-11-29 ENCOUNTER — Encounter: Payer: Self-pay | Admitting: Family Medicine

## 2021-12-09 ENCOUNTER — Ambulatory Visit: Payer: Medicaid Other | Attending: Family Medicine

## 2021-12-09 ENCOUNTER — Other Ambulatory Visit: Payer: Self-pay | Admitting: *Deleted

## 2021-12-09 ENCOUNTER — Ambulatory Visit: Payer: Medicaid Other | Admitting: *Deleted

## 2021-12-09 ENCOUNTER — Ambulatory Visit (HOSPITAL_BASED_OUTPATIENT_CLINIC_OR_DEPARTMENT_OTHER): Payer: Medicaid Other | Admitting: Maternal & Fetal Medicine

## 2021-12-09 ENCOUNTER — Encounter: Payer: Self-pay | Admitting: *Deleted

## 2021-12-09 ENCOUNTER — Other Ambulatory Visit: Payer: Self-pay

## 2021-12-09 VITALS — BP 128/73 | HR 96

## 2021-12-09 DIAGNOSIS — Z3480 Encounter for supervision of other normal pregnancy, unspecified trimester: Secondary | ICD-10-CM | POA: Diagnosis present

## 2021-12-09 DIAGNOSIS — Z363 Encounter for antenatal screening for malformations: Secondary | ICD-10-CM | POA: Insufficient documentation

## 2021-12-09 DIAGNOSIS — O99332 Smoking (tobacco) complicating pregnancy, second trimester: Secondary | ICD-10-CM | POA: Diagnosis not present

## 2021-12-09 DIAGNOSIS — Z3A19 19 weeks gestation of pregnancy: Secondary | ICD-10-CM | POA: Insufficient documentation

## 2021-12-09 DIAGNOSIS — O09522 Supervision of elderly multigravida, second trimester: Secondary | ICD-10-CM | POA: Diagnosis not present

## 2021-12-09 DIAGNOSIS — O09523 Supervision of elderly multigravida, third trimester: Secondary | ICD-10-CM

## 2021-12-09 DIAGNOSIS — O358XX0 Maternal care for other (suspected) fetal abnormality and damage, not applicable or unspecified: Secondary | ICD-10-CM | POA: Diagnosis not present

## 2021-12-09 DIAGNOSIS — G930 Cerebral cysts: Secondary | ICD-10-CM

## 2021-12-09 NOTE — Progress Notes (Signed)
MFM Brief Note  Cassandra Roth is a 40 yo G3P2 at [employer] with an EDD of 05/05/22 she is seen at the request of Dr. Catalina Antigua.  She has a low risk NIPS and AFP.  Single intrauterine pregnancy here for a detailed anatomy due unilateral right choroid plexus cyst Normal anatomy with measurements consistent with dates There is good fetal movement and amniotic fluid volume Suboptimal views of the left fetal hand was observed.  Choroid plexus cysts (CPCs) are well-demarcated, anechoic, fluid-filled structures within the choroid plexus of the lateral ventricles of the brain. They are not true cysts in the pathologic sense. CPCs are often called "soft sonographic signs" or "markers" of aneuploidy, because some studies have found an association between them and fetal chromosomal abnormalities. However, as more knowledge has accumulated, the initial importance attributed to this common sonographic finding has diminished.   When the CPCs are isolated, some consider them an anatomic variant.  The incidence ranges from 0.18% to 3.6% of fetuses scanned in the second trimester. Regardless, the potential clinical implications were reviewed with your patient. CPCs may be an isolated finding or associated with fetal anomalies. Associated anomalies are typically those seen with trisomy 18 and include congenital heart disease, clenched hands, single umbilical artery, intrauterine growth restriction, and rocker bottom feet.   A CPC is not a congenital brain defect.  In general, when a choroid plexus cyst is noted as an isolated finding and no other high-risk issues are noted, invasive karyotype analysis via an amniocentesis is usually not recommended.   Secondly, I discussed with Cassandra Roth that being 40 years young increased her risk for gestational diabetes, preeclampsia, and fetal growth restriction. She is taking low dose ASA.  Follow up growth is scheduled at 32 weeks.  I spent 20 minutes with >50% in face to face  consultation.

## 2021-12-17 ENCOUNTER — Other Ambulatory Visit: Payer: Self-pay

## 2021-12-17 ENCOUNTER — Ambulatory Visit (INDEPENDENT_AMBULATORY_CARE_PROVIDER_SITE_OTHER): Payer: Medicaid Other | Admitting: Family Medicine

## 2021-12-17 VITALS — BP 122/80 | HR 101 | Wt 198.0 lb

## 2021-12-17 DIAGNOSIS — O09522 Supervision of elderly multigravida, second trimester: Secondary | ICD-10-CM

## 2021-12-17 DIAGNOSIS — Z3480 Encounter for supervision of other normal pregnancy, unspecified trimester: Secondary | ICD-10-CM

## 2021-12-17 NOTE — Progress Notes (Signed)
° °  PRENATAL VISIT NOTE  Subjective:  Cassandra Roth is a 40 y.o. G3P2002 at [redacted]w[redacted]d being seen today for ongoing prenatal care.  She is currently monitored for the following issues for this low-risk pregnancy and has Supervision of other normal pregnancy, antepartum and Advanced maternal age in multigravida on their problem list.  Patient reports no complaints.  Contractions: Not present. Vag. Bleeding: None.  Movement: Present. Denies leaking of fluid.   The following portions of the patient's history were reviewed and updated as appropriate: allergies, current medications, past family history, past medical history, past social history, past surgical history and problem list.   Objective:   Vitals:   12/17/21 1436  BP: 122/80  Pulse: (!) 101  Weight: 198 lb (89.8 kg)    Fetal Status: Fetal Heart Rate (bpm): 159   Movement: Present     General:  Alert, oriented and cooperative. Patient is in no acute distress.  Skin: Skin is warm and dry. No rash noted.   Cardiovascular: Normal heart rate noted  Respiratory: Normal respiratory effort, no problems with respiration noted  Abdomen: Soft, gravid, appropriate for gestational age.  Pain/Pressure: Absent     Pelvic: Cervical exam deferred        Extremities: Normal range of motion.  Edema: None  Mental Status: Normal mood and affect. Normal behavior. Normal judgment and thought content.   Assessment and Plan:  Pregnancy: G3P2002 at [redacted]w[redacted]d 1. Supervision of other normal pregnancy, antepartum AFP today - AFP, Serum, Open Spina Bifida  2. Multigravida of advanced maternal age in second trimester Has nml anatomy and f/u scheduled in March  General obstetric precautions including but not limited to vaginal bleeding, contractions, leaking of fluid and fetal movement were reviewed in detail with the patient. Please refer to After Visit Summary for other counseling recommendations.   Return in 4 weeks (on 01/14/2022).  Future Appointments   Date Time Provider Department Center  01/14/2022  3:30 PM Adam Phenix, MD [facility] None  03/10/2022  7:45 AM [facility] NURSE [facility]  03/10/2022  8:00 AM [facility] US1 [facility]    Reva Bores, MD

## 2021-12-17 NOTE — Patient Instructions (Signed)

## 2021-12-19 LAB — AFP, SERUM, OPEN SPINA BIFIDA
AFP MoM: 0.53
AFP Value: 30.6 ng/mL
Gest. Age on Collection Date: 20.1 weeks
Maternal Age At EDD: 39.6 yr
OSBR Risk 1 IN: 10000
Test Results:: NEGATIVE
Weight: 158 [lb_av]

## 2021-12-22 NOTE — L&D Delivery Note (Signed)
OB/GYN Faculty Practice Delivery Note ? ?Cassandra Roth is a 41 y.o. [phone] s/p SVD at [redacted]w[redacted]d. She was admitted for IOL due to pre-eclampsia without severe features.  ? ?ROM: 10h 69m with clear fluid ?GBS Status: Negative ? ?Delivery Date/Time: 04/16/22 at 0859 ? ?Delivery: Called to room and patient was complete and pushing. Head delivered LOA. Nuchal cord present and reduced at the perineum. Shoulder and body delivered in usual fashion. Infant with spontaneous cry, placed on mother's abdomen, dried and stimulated. Cord clamped x 2 after 1-minute delay and cut by FOB under direct supervision. Cord blood drawn. Placenta delivered spontaneously with gentle cord traction. Fundus firm with massage and Pitocin. Labia, perineum, vagina, and cervix were inspected, and patient was found to have a 1st degree perineal laceration that was repaired with 3-0 Vicryl and found to be hemostatic.  ? ?Placenta: Intact, 3VC - sent to L&D ?Complications: None  ?Lacerations: 1st degree perineal  ?EBL: 65 cc ?Analgesia: Epidural  ? ?Infant: Viable female  APGARs 8 and 9  ? ?Vilma Meckel, MD ?OB/GYN Fellow, Faculty Practice ?

## 2022-01-14 ENCOUNTER — Other Ambulatory Visit: Payer: Self-pay

## 2022-01-14 ENCOUNTER — Ambulatory Visit (INDEPENDENT_AMBULATORY_CARE_PROVIDER_SITE_OTHER): Payer: Medicaid Other | Admitting: Obstetrics & Gynecology

## 2022-01-14 VITALS — BP 117/79 | HR 106 | Wt 202.0 lb

## 2022-01-14 DIAGNOSIS — Z3480 Encounter for supervision of other normal pregnancy, unspecified trimester: Secondary | ICD-10-CM

## 2022-01-14 DIAGNOSIS — D72829 Elevated white blood cell count, unspecified: Secondary | ICD-10-CM

## 2022-01-14 NOTE — Progress Notes (Signed)
° °  PRENATAL VISIT NOTE  Subjective:  Cassandra Roth is a 41 y.o. G3P2002 at [redacted]w[redacted]d being seen today for ongoing prenatal care.  She is currently monitored for the following issues for this low-risk pregnancy and has Supervision of other normal pregnancy, antepartum and Advanced maternal age in multigravida on their problem list.  Patient reports  5 minute episode of tachycardia dizziness  near fainting 3 days ago .  Contractions: Not present. Vag. Bleeding: None.  Movement: Present. Denies leaking of fluid.   The following portions of the patient's history were reviewed and updated as appropriate: allergies, current medications, past family history, past medical history, past social history, past surgical history and problem list.   Objective:   Vitals:   01/14/22 1532  BP: 117/79  Pulse: (!) 106  Weight: 202 lb (91.6 kg)    Fetal Status: Fetal Heart Rate (bpm): 150   Movement: Present     General:  Alert, oriented and cooperative. Patient is in no acute distress.  Skin: Skin is warm and dry. No rash noted.   Cardiovascular: Normal heart rate noted  Respiratory: Normal respiratory effort, no problems with respiration noted  Abdomen: Soft, gravid, appropriate for gestational age.  Pain/Pressure: Absent     Pelvic: Cervical exam deferred        Extremities: Normal range of motion.  Edema: None  Mental Status: Normal mood and affect. Normal behavior. Normal judgment and thought content.   Assessment and Plan:  Pregnancy: G3P2002 at [redacted]w[redacted]d 1. Supervision of other normal pregnancy, antepartum Episode of sx tachycardia for evaluation - AMB Referral to Cardio Obstetrics  Preterm labor symptoms and general obstetric precautions including but not limited to vaginal bleeding, contractions, leaking of fluid and fetal movement were reviewed in detail with the patient. Please refer to After Visit Summary for other counseling recommendations.   Return in about 4 weeks (around  02/11/2022).  Future Appointments  Date Time Provider Department Center  02/11/2022  3:30 PM Constant, Peggy, MD [facility] None  03/10/2022  7:45 AM [facility] NURSE [facility]  03/10/2022  8:00 AM [facility] US1 [facility]    Scheryl Darter, MD

## 2022-01-15 LAB — CBC
Hematocrit: 36.8 % (ref 34.0–46.6)
Hemoglobin: 12.8 g/dL (ref 11.1–15.9)
MCH: 32.4 pg (ref 26.6–33.0)
MCHC: 34.8 g/dL (ref 31.5–35.7)
MCV: 93 fL (ref 79–97)
Platelets: 487 10*3/uL — ABNORMAL HIGH (ref 150–450)
RBC: 3.95 x10E6/uL (ref 3.77–5.28)
RDW: 13.1 % (ref 11.7–15.4)
WBC: 20.2 10*3/uL (ref 3.4–10.8)

## 2022-01-16 DIAGNOSIS — D72829 Elevated white blood cell count, unspecified: Secondary | ICD-10-CM | POA: Insufficient documentation

## 2022-01-27 ENCOUNTER — Inpatient Hospital Stay (HOSPITAL_COMMUNITY): Payer: Commercial Managed Care - HMO

## 2022-01-27 ENCOUNTER — Other Ambulatory Visit: Payer: Self-pay

## 2022-01-27 ENCOUNTER — Inpatient Hospital Stay (HOSPITAL_COMMUNITY)
Admission: AD | Admit: 2022-01-27 | Discharge: 2022-02-01 | Disposition: A | Discharge status: Home / Self Care | Payer: Commercial Managed Care - HMO | Attending: Family Medicine | Admitting: Family Medicine

## 2022-01-27 ENCOUNTER — Encounter (HOSPITAL_COMMUNITY): Payer: Self-pay | Admitting: Family Medicine

## 2022-01-27 DIAGNOSIS — O99332 Smoking (tobacco) complicating pregnancy, second trimester: Secondary | ICD-10-CM | POA: Insufficient documentation

## 2022-01-27 DIAGNOSIS — Z7982 Long term (current) use of aspirin: Secondary | ICD-10-CM | POA: Insufficient documentation

## 2022-01-27 DIAGNOSIS — D72829 Elevated white blood cell count, unspecified: Secondary | ICD-10-CM | POA: Insufficient documentation

## 2022-01-27 DIAGNOSIS — Z3A26 26 weeks gestation of pregnancy: Secondary | ICD-10-CM | POA: Diagnosis not present

## 2022-01-27 DIAGNOSIS — F17210 Nicotine dependence, cigarettes, uncomplicated: Secondary | ICD-10-CM | POA: Diagnosis present

## 2022-01-27 DIAGNOSIS — O26832 Pregnancy related renal disease, second trimester: Secondary | ICD-10-CM | POA: Insufficient documentation

## 2022-01-27 DIAGNOSIS — M549 Dorsalgia, unspecified: Secondary | ICD-10-CM | POA: Diagnosis present

## 2022-01-27 DIAGNOSIS — O09522 Supervision of elderly multigravida, second trimester: Secondary | ICD-10-CM | POA: Diagnosis not present

## 2022-01-27 DIAGNOSIS — N200 Calculus of kidney: Secondary | ICD-10-CM | POA: Diagnosis not present

## 2022-01-27 DIAGNOSIS — O99112 Other diseases of the blood and blood-forming organs and certain disorders involving the immune mechanism complicating pregnancy, second trimester: Secondary | ICD-10-CM | POA: Diagnosis not present

## 2022-01-27 DIAGNOSIS — Z20822 Contact with and (suspected) exposure to covid-19: Secondary | ICD-10-CM | POA: Insufficient documentation

## 2022-01-27 DIAGNOSIS — Z3480 Encounter for supervision of other normal pregnancy, unspecified trimester: Secondary | ICD-10-CM

## 2022-01-27 LAB — CBC WITH DIFFERENTIAL/PLATELET
Abs Immature Granulocytes: 0.28 10*3/uL — ABNORMAL HIGH (ref 0.00–0.07)
Basophils Absolute: 0.1 10*3/uL (ref 0.0–0.1)
Basophils Relative: 0 %
Eosinophils Absolute: 0.2 10*3/uL (ref 0.0–0.5)
Eosinophils Relative: 1 %
HCT: 34.1 % — ABNORMAL LOW (ref 36.0–46.0)
Hemoglobin: 11.9 g/dL — ABNORMAL LOW (ref 12.0–15.0)
Immature Granulocytes: 1 %
Lymphocytes Relative: 9 %
Lymphs Abs: 2.3 10*3/uL (ref 0.7–4.0)
MCH: 33.2 pg (ref 26.0–34.0)
MCHC: 34.9 g/dL (ref 30.0–36.0)
MCV: 95.3 fL (ref 80.0–100.0)
Monocytes Absolute: 1.3 10*3/uL — ABNORMAL HIGH (ref 0.1–1.0)
Monocytes Relative: 5 %
Neutro Abs: 21.3 10*3/uL — ABNORMAL HIGH (ref 1.7–7.7)
Neutrophils Relative %: 84 %
Platelets: 415 10*3/uL — ABNORMAL HIGH (ref 150–400)
RBC: 3.58 MIL/uL — ABNORMAL LOW (ref 3.87–5.11)
RDW: 14.1 % (ref 11.5–15.5)
WBC: 25.4 10*3/uL — ABNORMAL HIGH (ref 4.0–10.5)
nRBC: 0 % (ref 0.0–0.2)

## 2022-01-27 LAB — COMPREHENSIVE METABOLIC PANEL
ALT: 14 U/L (ref 0–44)
AST: 9 U/L — ABNORMAL LOW (ref 15–41)
Albumin: 2.6 g/dL — ABNORMAL LOW (ref 3.5–5.0)
Alkaline Phosphatase: 96 U/L (ref 38–126)
Anion gap: 8 (ref 5–15)
BUN: 6 mg/dL (ref 6–20)
CO2: 22 mmol/L (ref 22–32)
Calcium: 9.2 mg/dL (ref 8.9–10.3)
Chloride: 104 mmol/L (ref 98–111)
Creatinine, Ser: 0.59 mg/dL (ref 0.44–1.00)
GFR, Estimated: >60 mL/min (ref >60)
Glucose, Bld: 96 mg/dL (ref 70–99)
Potassium: 3.9 mmol/L (ref 3.5–5.1)
Sodium: 134 mmol/L — ABNORMAL LOW (ref 135–145)
Total Bilirubin: 0.3 mg/dL (ref 0.3–1.2)
Total Protein: 6 g/dL — ABNORMAL LOW (ref 6.5–8.1)

## 2022-01-27 LAB — RESP PANEL BY RT-PCR (FLU A&B, COVID) ARPGX2
Influenza A by PCR: NEGATIVE
Influenza B by PCR: NEGATIVE
SARS Coronavirus 2 by RT PCR: NEGATIVE

## 2022-01-27 LAB — TYPE AND SCREEN
ABO/RH(D): A NEG
Antibody Screen: NEGATIVE

## 2022-01-27 LAB — URINALYSIS, ROUTINE W REFLEX MICROSCOPIC
Bilirubin Urine: NEGATIVE
Glucose, UA: NEGATIVE mg/dL
Ketones, ur: 15 mg/dL — AB
Leukocytes,Ua: NEGATIVE
Nitrite: NEGATIVE
Protein, ur: NEGATIVE mg/dL
Specific Gravity, Urine: 1.02 (ref 1.005–1.030)
pH: 6.5 (ref 5.0–8.0)

## 2022-01-27 LAB — URINALYSIS, MICROSCOPIC (REFLEX)

## 2022-01-27 MED ORDER — DOCUSATE SODIUM 100 MG PO CAPS
100.0000 mg | ORAL_CAPSULE | Freq: Every day | ORAL | Status: DC
  Administered 2022-01-27 – 2022-02-01 (×6): 100 mg via ORAL
  Filled 2022-01-27 (×6): qty 1

## 2022-01-27 MED ORDER — HYDROMORPHONE HCL 1 MG/ML IJ SOLN
1.0000 mg | Freq: Once | INTRAMUSCULAR | Status: AC
  Administered 2022-01-27: 1 mg via INTRAVENOUS
  Filled 2022-01-27: qty 1

## 2022-01-27 MED ORDER — HYDROMORPHONE 1 MG/ML IV SOLN
INTRAVENOUS | Status: DC
  Administered 2022-01-27: 0.6 mg via INTRAVENOUS
  Administered 2022-01-27: 0.3 mg via INTRAVENOUS
  Administered 2022-01-27: 30 mg via INTRAVENOUS
  Administered 2022-01-28: 0.6 mg via INTRAVENOUS
  Administered 2022-01-28: 0.9 mg via INTRAVENOUS
  Administered 2022-01-28: 2.1 mg via INTRAVENOUS
  Administered 2022-01-28: 1.25 mg via INTRAVENOUS
  Administered 2022-01-28: 0.9 mg via INTRAVENOUS
  Administered 2022-01-28: 5.3 mg via INTRAVENOUS
  Administered 2022-01-29: 1.2 mL via INTRAVENOUS
  Administered 2022-01-29: 0.6 mg via INTRAVENOUS
  Administered 2022-01-29: 1.2 mg via INTRAVENOUS
  Administered 2022-01-29: 1.2 mL via INTRAVENOUS
  Administered 2022-01-29: 5 mL via INTRAVENOUS
  Administered 2022-01-29: 1.2 mg via INTRAVENOUS
  Administered 2022-01-30: 2.4 mg via INTRAVENOUS
  Administered 2022-01-30: 1.2 mg via INTRAVENOUS
  Administered 2022-01-30: 0.3 mL via INTRAVENOUS
  Administered 2022-01-30: 0.3 mg via INTRAVENOUS
  Administered 2022-01-30: 3.6 mL via INTRAVENOUS
  Administered 2022-01-31: 2.4 mg via INTRAVENOUS
  Administered 2022-01-31: 1.2 mg via INTRAVENOUS
  Administered 2022-01-31: 2.1 mg via INTRAVENOUS
  Administered 2022-01-31: 1.8 mg via INTRAVENOUS
  Administered 2022-01-31: 1.5 mg via INTRAVENOUS
  Administered 2022-01-31: 2.1 mg via INTRAVENOUS
  Administered 2022-02-01: 0.9 mg via INTRAVENOUS
  Administered 2022-02-01 (×2): 0.3 mg via INTRAVENOUS
  Filled 2022-01-27 (×12): qty 30

## 2022-01-27 MED ORDER — LACTATED RINGERS IV BOLUS
1000.0000 mL | Freq: Once | INTRAVENOUS | Status: AC
  Administered 2022-01-27: 1000 mL via INTRAVENOUS

## 2022-01-27 MED ORDER — PROMETHAZINE HCL 25 MG/ML IJ SOLN
12.5000 mg | Freq: Four times a day (QID) | INTRAMUSCULAR | Status: DC | PRN
  Administered 2022-01-28: 12.5 mg via INTRAVENOUS
  Filled 2022-01-27 (×2): qty 0.5
  Filled 2022-01-27: qty 12.5

## 2022-01-27 MED ORDER — ACETAMINOPHEN 325 MG PO TABS
650.0000 mg | ORAL_TABLET | ORAL | Status: DC | PRN
  Administered 2022-01-30 – 2022-02-01 (×2): 650 mg via ORAL
  Filled 2022-01-27 (×2): qty 2

## 2022-01-27 MED ORDER — PRENATAL MULTIVITAMIN CH
1.0000 | ORAL_TABLET | Freq: Every day | ORAL | Status: DC
  Administered 2022-01-27 – 2022-02-01 (×6): 1 via ORAL
  Filled 2022-01-27 (×6): qty 1

## 2022-01-27 MED ORDER — DIPHENHYDRAMINE HCL 50 MG/ML IJ SOLN: 12.5000 mg | Freq: Four times a day (QID) | INTRAMUSCULAR | Status: DC | PRN

## 2022-01-27 MED ORDER — TAMSULOSIN HCL 0.4 MG PO CAPS
0.4000 mg | ORAL_CAPSULE | Freq: Every day | ORAL | Status: DC
  Administered 2022-01-27 – 2022-02-01 (×6): 0.4 mg via ORAL
  Filled 2022-01-27 (×6): qty 1

## 2022-01-27 MED ORDER — NALOXONE HCL 0.4 MG/ML IJ SOLN: 0.4000 mg | INTRAMUSCULAR | Status: DC | PRN

## 2022-01-27 MED ORDER — SODIUM CHLORIDE 0.9% FLUSH: 9.0000 mL | INTRAVENOUS | Status: DC | PRN

## 2022-01-27 MED ORDER — ZOLPIDEM TARTRATE 5 MG PO TABS
5.0000 mg | ORAL_TABLET | Freq: Every evening | ORAL | Status: DC | PRN
  Administered 2022-01-28 – 2022-01-31 (×5): 5 mg via ORAL
  Filled 2022-01-27 (×5): qty 1

## 2022-01-27 MED ORDER — LACTATED RINGERS IV SOLN: INTRAVENOUS | Status: DC

## 2022-01-27 MED ORDER — CALCIUM CARBONATE ANTACID 500 MG PO CHEW: 2.0000 | CHEWABLE_TABLET | ORAL | Status: DC | PRN

## 2022-01-27 MED ORDER — DIPHENHYDRAMINE HCL 12.5 MG/5ML PO ELIX
12.5000 mg | ORAL_SOLUTION | Freq: Four times a day (QID) | ORAL | Status: DC | PRN
  Filled 2022-01-27: qty 5

## 2022-01-27 MED ORDER — ONDANSETRON HCL 4 MG/2ML IJ SOLN
4.0000 mg | Freq: Four times a day (QID) | INTRAMUSCULAR | Status: DC | PRN
  Administered 2022-01-27 – 2022-01-31 (×10): 4 mg via INTRAVENOUS
  Filled 2022-01-27 (×10): qty 2

## 2022-01-27 NOTE — H&P (Signed)
Patient presents with   Back Pain   Back Pain Pertinent negatives include no abdominal pain, chest pain, dysuria, fever, headaches or pelvic pain.   Cassandra Roth is a 41yo [id number] at [redacted]w[redacted]d who presents today with left back and flank pain. The pain started this weekend and acutely worsened when she woke up this morning. She is unable to find a comfortable position and is in pain both while moving and at rest. This pain feels the same as the kidney stone she had during her second pregnancy. That stone was also on the left side and required stent placement. She has been nauseous and has not been able to eat today, but she has not vomited. She is able to drink fluids.  OB History     Gravida  3   Para  2   Term  2   Preterm      AB      Living  2      SAB      IAB      Ectopic      Multiple      Live Births  2           Past Medical History:  Diagnosis Date   Anxiety    Back pain    Migraine    Ovarian cyst    Seizures ([place])    after taking zoloft    Past Surgical History:  Procedure Laterality Date   OVARIAN CYST REMOVAL      Family History  Problem Relation Age of Onset   Hypertension Mother    Diabetes Mother    Kidney cancer Mother    Throat cancer Maternal Grandmother    Dementia Maternal Grandmother     Social History   Tobacco Use   Smoking status: Every Day    Packs/day: 0.50    Types: Cigarettes   Smokeless tobacco: Never  Vaping Use   Vaping Use: Never used  Substance Use Topics   Alcohol use: Not Currently    Comment: not since confirmed pregnancy   Drug use: Yes    Types: Other-see comments    Comment: stopped methadone 3 weeks ago    Allergies:  Allergies  Allergen Reactions   Zoloft [Sertraline Hcl] Other (See Comments)    Seizure    Medications Prior to Admission  Medication Sig Dispense Refill Last Dose   aspirin EC 81 MG tablet Take 1 tablet (81 mg total) by mouth daily. Swallow whole. 60 tablet 4    Blood Pressure  Monitoring (BLOOD PRESSURE KIT) DEVI 1 kit by Does not apply route once a week. 1 each 0    Misc. Devices (GOJJI WEIGHT SCALE) MISC 1 Device by Does not apply route every 30 (thirty) days. 1 each 0    Prenatal Vit-Fe Fumarate-FA (PRENATAL PLUS VITAMIN/MINERAL) 27-1 MG TABS Take 1 tablet by mouth daily.       Review of Systems  Constitutional:  Negative for chills and fever.  Eyes:  Negative for visual disturbance.  Respiratory:  Negative for chest tightness and shortness of breath.   Cardiovascular:  Negative for chest pain and leg swelling.  Gastrointestinal:  Positive for nausea. Negative for abdominal pain, constipation, diarrhea and vomiting.  Genitourinary:  Positive for flank pain. Negative for dysuria, frequency, hematuria, pelvic pain, vaginal bleeding, vaginal discharge and vaginal pain.  Musculoskeletal:  Positive for back pain.  Neurological:  Negative for headaches.   Physical Exam   Blood pressure 133/81, temperature 98  F (36.7 C), temperature source Oral, resp. rate 18, height $RemoveBe'5\' 3"'QHTuPpRuk$  (1.6 m), weight 94 kg, last menstrual period 07/29/2021, SpO2 98 %.  Physical Exam Constitutional:      General: She is in acute distress.     Appearance: She is not toxic-appearing.  HENT:     Head: Normocephalic and atraumatic.  Cardiovascular:     Rate and Rhythm: Normal rate and regular rhythm.     Heart sounds: Normal heart sounds.  Pulmonary:     Effort: Pulmonary effort is normal.     Breath sounds: Normal breath sounds.  Abdominal:     Tenderness: There is left CVA tenderness.  Skin:    General: Skin is warm and dry.  Neurological:     General: No focal deficit present.     Mental Status: She is alert.   NST:  Baseline: 150 Variability: moderate Accels: 10x10 Decels: none Toco: none Reactive/Appropriate for GA   Results for orders placed or performed during the hospital encounter of 01/27/22 (from the past 24 hour(s))  Urinalysis, Routine w reflex microscopic Urine,  Clean Catch     Status: Abnormal   Collection Time: 01/27/22 11:30 AM  Result Value Ref Range   Color, Urine YELLOW YELLOW   APPearance CLEAR CLEAR   Specific Gravity, Urine 1.020 1.005 - 1.030   pH 6.5 5.0 - 8.0   Glucose, UA NEGATIVE NEGATIVE mg/dL   Hgb urine dipstick MODERATE (A) NEGATIVE   Bilirubin Urine NEGATIVE NEGATIVE   Ketones, ur 15 (A) NEGATIVE mg/dL   Protein, ur NEGATIVE NEGATIVE mg/dL   Nitrite NEGATIVE NEGATIVE   Leukocytes,Ua NEGATIVE NEGATIVE  Urinalysis, Microscopic (reflex)     Status: Abnormal   Collection Time: 01/27/22 11:30 AM  Result Value Ref Range   RBC / HPF 21-50 0 - 5 RBC/hpf   WBC, UA 0-5 0 - 5 WBC/hpf   Bacteria, UA FEW (A) NONE SEEN   Squamous Epithelial / LPF 6-10 0 - 5   Mucus PRESENT   CBC with Differential/Platelet     Status: Abnormal   Collection Time: 01/27/22 12:20 PM  Result Value Ref Range   WBC 25.4 (H) 4.0 - 10.5 K/uL   RBC 3.58 (L) 3.87 - 5.11 MIL/uL   Hemoglobin 11.9 (L) 12.0 - 15.0 g/dL   HCT 34.1 (L) 36.0 - 46.0 %   MCV 95.3 80.0 - 100.0 fL   MCH 33.2 26.0 - 34.0 pg   MCHC 34.9 30.0 - 36.0 g/dL   RDW 14.1 11.5 - 15.5 %   Platelets 415 (H) 150 - 400 K/uL   nRBC 0.0 0.0 - 0.2 %   Neutrophils Relative % 84 %   Neutro Abs 21.3 (H) 1.7 - 7.7 K/uL   Lymphocytes Relative 9 %   Lymphs Abs 2.3 0.7 - 4.0 K/uL   Monocytes Relative 5 %   Monocytes Absolute 1.3 (H) 0.1 - 1.0 K/uL   Eosinophils Relative 1 %   Eosinophils Absolute 0.2 0.0 - 0.5 K/uL   Basophils Relative 0 %   Basophils Absolute 0.1 0.0 - 0.1 K/uL   WBC Morphology MORPHOLOGY UNREMARKABLE    RBC Morphology MORPHOLOGY UNREMARKABLE    Smear Review MORPHOLOGY UNREMARKABLE    Immature Granulocytes 1 %   Abs Immature Granulocytes 0.28 (H) 0.00 - 0.07 K/uL  Comprehensive metabolic panel     Status: Abnormal   Collection Time: 01/27/22 12:20 PM  Result Value Ref Range   Sodium 134 (L) 135 - 145 mmol/L  Potassium 3.9 3.5 - 5.1 mmol/L   Chloride 104 98 - 111 mmol/L    CO2 22 22 - 32 mmol/L   Glucose, Bld 96 70 - 99 mg/dL   BUN 6 6 - 20 mg/dL   Creatinine, Ser 0.59 0.44 - 1.00 mg/dL   Calcium 9.2 8.9 - 10.3 mg/dL   Total Protein 6.0 (L) 6.5 - 8.1 g/dL   Albumin 2.6 (L) 3.5 - 5.0 g/dL   AST 9 (L) 15 - 41 U/L   ALT 14 0 - 44 U/L   Alkaline Phosphatase 96 38 - 126 U/L   Total Bilirubin 0.3 0.3 - 1.2 mg/dL   GFR, Estimated >60 >60 mL/min   Anion gap 8 5 - 15   US Renal  Result Date: 01/27/2022 CLINICAL DATA:  Left flank pain.  Twenty-six weeks pregnant. EXAM: RENAL / URINARY TRACT ULTRASOUND COMPLETE COMPARISON:  CT abdomen and pelvis 03/21/2011. FINDINGS: Right Kidney: Renal measurements: 11.9 x 5.6 x 5.6 cm = volume: 197 mL. Echogenicity within normal limits. No mass or hydronephrosis visualized. Left Kidney: Renal measurements: 12.5 x 5.3 x 6.5 cm = volume: 127 mL. Echogenicity within normal limits. Echogenic 10 mm shadowing likely stone is seen within the mid to inferior left kidney. No mass or hydronephrosis visualized. Bladder: Appears normal for degree of bladder distention. Other: None. IMPRESSION: Non-obstructing left renal stone measuring up to 10 mm. No hydronephrosis within either kidney. Electronically Signed   By: Yvonne Kendall M.D.   On: 01/27/2022 13:59    MAU Course  Procedures  MDM Left flank/back pain with CVA tenderness elicited on physical exam and symptoms similar to prior kidney stone. UA with moderate Hgb and ketones; ketones likely due to not eating today. Recurrent kidney stone is most likely etiology. Pyelonephritis is less likely due to lack of fever. However, WBC elevated at 25.4. Renal ultrasound impression with non-obstructing left renal stone measuring up to 24mm. - Received Dilaudid 1 mg - Received phenergan 12.5 mg - 1L LR bolus  DW. Dr. Nehemiah Settle and recommends admission for pain control. DW Dr. Kennon Rounds and she agrees with plan for admission.   Assessment and Plan  Plan to admit to [facility] for pain control.    Marcille Buffy DNP, CNM  01/27/22  2:25 PM

## 2022-01-27 NOTE — MAU Note (Signed)
Presents with c/o left flank pain that began this morning.  Reports had mild flank pain this weekend, but has worsened.  Denies UTI symptoms, no dysuria or frequency.  Endorses nausea, no vomiting.  Reports hx kidney stones. Denies LOF or VB.  Endorses +FM.

## 2022-01-27 NOTE — MAU Note (Signed)
G3P2 at 26 weeks c/o left flank pain with positive CVA tenderness.  H/o recurrent kidney stone with stint placement in 2010 after pregnancy.

## 2022-01-27 NOTE — MAU Provider Note (Addendum)
History     CSN: [id number]  Arrival date and time: 01/27/22 1041   Event Date/Time   First Provider Initiated Contact with Patient 01/27/22 1202      Chief Complaint  Patient presents with   Back Pain   Back Pain Pertinent negatives include no abdominal pain, chest pain, dysuria, fever, headaches or pelvic pain.   Cassandra Roth is a 41yo [id number] at 63w0dwho presents today with left back and flank pain. The pain started this weekend and acutely worsened when she woke up this morning. She is unable to find a comfortable position and is in pain both while moving and at rest. This pain feels the same as the kidney stone she had during her second pregnancy. That stone was also on the left side and required stent placement. She has been nauseous and has not been able to eat today, but she has not vomited. She is able to drink fluids.  OB History     Gravida  3   Para  2   Term  2   Preterm      AB      Living  2      SAB      IAB      Ectopic      Multiple      Live Births  2           Past Medical History:  Diagnosis Date   Anxiety    Back pain    Migraine    Ovarian cyst    [place]    Past Surgical History:  Procedure Laterality Date   OVARIAN CYST REMOVAL      Family History  Problem Relation Age of Onset   Hypertension Mother    Diabetes Mother    Kidney cancer Mother    Throat cancer Maternal Grandmother    Dementia Maternal Grandmother     Social History   Tobacco Use   Smoking status: Every Day    Packs/day: 0.50    Types: Cigarettes   Smokeless tobacco: Never  Vaping Use   Vaping Use: Never used  Substance Use Topics   Alcohol use: Not Currently    Comment: not since confirmed pregnancy   Drug use: Yes    Types: Other-see comments    Comment: stopped methadone 3 weeks ago    Allergies:  Allergies  Allergen Reactions   Zoloft [Sertraline Hcl] Other (See Comments)    Seizure    Medications  Prior to Admission  Medication Sig Dispense Refill Last Dose   aspirin EC 81 MG tablet Take 1 tablet (81 mg total) by mouth daily. Swallow whole. 60 tablet 4    Blood Pressure Monitoring (BLOOD PRESSURE KIT) DEVI 1 kit by Does not apply route once a week. 1 each 0    Misc. Devices (GOJJI WEIGHT SCALE) MISC 1 Device by Does not apply route every 30 (thirty) days. 1 each 0    Prenatal Vit-Fe Fumarate-FA (PRENATAL PLUS VITAMIN/MINERAL) 27-1 MG TABS Take 1 tablet by mouth daily.       Review of Systems  Constitutional:  Negative for chills and fever.  Eyes:  Negative for visual disturbance.  Respiratory:  Negative for chest tightness and shortness of breath.   Cardiovascular:  Negative for chest pain and leg swelling.  Gastrointestinal:  Positive for nausea. Negative for abdominal pain, constipation, diarrhea and vomiting.  Genitourinary:  Positive for flank pain. Negative for  dysuria, frequency, hematuria, pelvic pain, vaginal bleeding, vaginal discharge and vaginal pain.  Musculoskeletal:  Positive for back pain.  Neurological:  Negative for headaches.   Physical Exam   Blood pressure 133/81, temperature 98 F (36.7 C), temperature source Oral, resp. rate 18, height _0  (1.6 m), weight 94 kg, last menstrual period 07/29/2021, SpO2 98 %.  Physical Exam Constitutional:      General: She is in acute distress.     Appearance: She is not toxic-appearing.  HENT:     Head: Normocephalic and atraumatic.  Cardiovascular:     Rate and Rhythm: Normal rate and regular rhythm.     Heart sounds: Normal heart sounds.  Pulmonary:     Effort: Pulmonary effort is normal.     Breath sounds: Normal breath sounds.  Abdominal:     Tenderness: There is left CVA tenderness.  Skin:    General: Skin is warm and dry.  Neurological:     General: No focal deficit present.     Mental Status: She is alert.   Results for orders placed or performed during the hospital encounter of 01/27/22 (from the past  24 hour(s))  Urinalysis, Routine w reflex microscopic Urine, Clean Catch     Status: Abnormal   Collection Time: 01/27/22 11:30 AM  Result Value Ref Range   Color, Urine YELLOW YELLOW   APPearance CLEAR CLEAR   Specific Gravity, Urine 1.020 1.005 - 1.030   pH 6.5 5.0 - 8.0   Glucose, UA NEGATIVE NEGATIVE mg/dL   Hgb urine dipstick MODERATE (A) NEGATIVE   Bilirubin Urine NEGATIVE NEGATIVE   Ketones, ur 15 (A) NEGATIVE mg/dL   Protein, ur NEGATIVE NEGATIVE mg/dL   Nitrite NEGATIVE NEGATIVE   Leukocytes,Ua NEGATIVE NEGATIVE  Urinalysis, Microscopic (reflex)     Status: Abnormal   Collection Time: 01/27/22 11:30 AM  Result Value Ref Range   RBC / HPF 21-50 0 - 5 RBC/hpf   WBC, UA 0-5 0 - 5 WBC/hpf   Bacteria, UA FEW (A) NONE SEEN   Squamous Epithelial / LPF 6-10 0 - 5   Mucus PRESENT   CBC with Differential/Platelet     Status: Abnormal   Collection Time: 01/27/22 12:20 PM  Result Value Ref Range   WBC 25.4 (H) 4.0 - 10.5 K/uL   RBC 3.58 (L) 3.87 - 5.11 MIL/uL   Hemoglobin 11.9 (L) 12.0 - 15.0 g/dL   HCT 34.1 (L) 36.0 - 46.0 %   MCV 95.3 80.0 - 100.0 fL   MCH 33.2 26.0 - 34.0 pg   MCHC 34.9 30.0 - 36.0 g/dL   RDW 14.1 11.5 - 15.5 %   Platelets 415 (H) 150 - 400 K/uL   nRBC 0.0 0.0 - 0.2 %   Neutrophils Relative % 84 %   Neutro Abs 21.3 (H) 1.7 - 7.7 K/uL   Lymphocytes Relative 9 %   Lymphs Abs 2.3 0.7 - 4.0 K/uL   Monocytes Relative 5 %   Monocytes Absolute 1.3 (H) 0.1 - 1.0 K/uL   Eosinophils Relative 1 %   Eosinophils Absolute 0.2 0.0 - 0.5 K/uL   Basophils Relative 0 %   Basophils Absolute 0.1 0.0 - 0.1 K/uL   WBC Morphology MORPHOLOGY UNREMARKABLE    RBC Morphology MORPHOLOGY UNREMARKABLE    Smear Review MORPHOLOGY UNREMARKABLE    Immature Granulocytes 1 %   Abs Immature Granulocytes 0.28 (H) 0.00 - 0.07 K/uL  Comprehensive metabolic panel     Status: Abnormal   Collection  Time: 01/27/22 12:20 PM  Result Value Ref Range   Sodium 134 (L) 135 - 145 mmol/L    Potassium 3.9 3.5 - 5.1 mmol/L   Chloride 104 98 - 111 mmol/L   CO2 22 22 - 32 mmol/L   Glucose, Bld 96 70 - 99 mg/dL   BUN 6 6 - 20 mg/dL   Creatinine, Ser 0.59 0.44 - 1.00 mg/dL   Calcium 9.2 8.9 - 10.3 mg/dL   Total Protein 6.0 (L) 6.5 - 8.1 g/dL   Albumin 2.6 (L) 3.5 - 5.0 g/dL   AST 9 (L) 15 - 41 U/L   ALT 14 0 - 44 U/L   Alkaline Phosphatase 96 38 - 126 U/L   Total Bilirubin 0.3 0.3 - 1.2 mg/dL   GFR, Estimated >60 >60 mL/min   Anion gap 8 5 - 15   US Renal  Result Date: 01/27/2022 CLINICAL DATA:  Left flank pain.  Twenty-six weeks pregnant. EXAM: RENAL / URINARY TRACT ULTRASOUND COMPLETE COMPARISON:  CT abdomen and pelvis 03/21/2011. FINDINGS: Right Kidney: Renal measurements: 11.9 x 5.6 x 5.6 cm = volume: 197 mL. Echogenicity within normal limits. No mass or hydronephrosis visualized. Left Kidney: Renal measurements: 12.5 x 5.3 x 6.5 cm = volume: 127 mL. Echogenicity within normal limits. Echogenic 10 mm shadowing likely stone is seen within the mid to inferior left kidney. No mass or hydronephrosis visualized. Bladder: Appears normal for degree of bladder distention. Other: None. IMPRESSION: Non-obstructing left renal stone measuring up to 10 mm. No hydronephrosis within either kidney. Electronically Signed   By: Yvonne Kendall M.D.   On: 01/27/2022 13:59    MAU Course  Procedures  MDM Left flank/back pain with CVA tenderness elicited on physical exam and symptoms similar to prior kidney stone. UA with moderate Hgb and ketones; ketones likely due to not eating today. Recurrent kidney stone is most likely etiology. Pyelonephritis is less likely due to lack of fever. However, WBC elevated at 25.4. Renal ultrasound impression with non-obstructing left renal stone measuring up to 30m. - Received Dilaudid 1 mg - Received phenergan 12.5 mg - 1L LR bolus  DW. Dr. SNehemiah Settleand recommends admission for pain control. DW Dr. PKennon Roundsand she agrees with plan for admission.   Assessment and  Plan  [facility].   AReggy Eye2/05/2022, 12:11 PM    Attestation of Supervision of Student:  I confirm that I have verified the information documented in the medical students note and that I have also personally reperformed the history, physical exam and all medical decision making activities.  I have verified that all services and findings are accurately documented in this student's note; and I agree with management and plan as outlined in the documentation. I have also made any necessary editorial changes.  HMarcille BuffyDNP, CNM  01/27/22  2:23 PM

## 2022-01-28 DIAGNOSIS — N200 Calculus of kidney: Secondary | ICD-10-CM | POA: Diagnosis not present

## 2022-01-28 DIAGNOSIS — O26832 Pregnancy related renal disease, second trimester: Secondary | ICD-10-CM | POA: Diagnosis not present

## 2022-01-28 DIAGNOSIS — Z3A26 26 weeks gestation of pregnancy: Secondary | ICD-10-CM

## 2022-01-28 MED ORDER — NICOTINE 21 MG/24HR TD PT24
21.0000 mg | MEDICATED_PATCH | Freq: Every day | TRANSDERMAL | Status: DC
  Administered 2022-01-28 – 2022-02-01 (×5): 21 mg via TRANSDERMAL
  Filled 2022-01-28 (×5): qty 1

## 2022-01-28 NOTE — Progress Notes (Signed)
Patient ID: Cassandra Roth, female   DOB: 04-Apr-1981, 41 y.o.   MRN: [id number] FACULTY PRACTICE ANTEPARTUM(COMPREHENSIVE) NOTE  Cassandra Roth is a 41 y.o. G3P2002 at [redacted]w[redacted]d by best clinical estimate who is admitted for nephrolithiasis.   Fetal presentation is unsure. Length of Stay:  0  Days  ASSESSMENT: Principal Problem:   Nephrolithiasis Active Problems:   Leukocytosis   Cigarette nicotine dependence without complication   PLAN: Nephrolithiasis PCA - pain is fairly well controlled IVF - to improve flow and hope to pass stone Strain urine - has passed some portion Flomax Antiemetics prn  Leukocytosis Unclear etiology Repeat in am.  Tobacco Dependence Doing well right now on no replacement  AMA On Baby ASA  Subjective: Feels ok, still having pain. Reports found something in her urine during straining. Still having intermittent nausea. Patient reports the fetal movement as active. Patient reports uterine contraction  activity as none. Patient reports  vaginal bleeding as none. Patient describes fluid per vagina as None.  Vitals:  Blood pressure 106/69, pulse 91, temperature (!) 97.4 F (36.3 C), temperature source Axillary, resp. rate 15, height 5\' 3"  (1.6 m), weight 94 kg, last menstrual period 07/29/2021, SpO2 99 %, unknown if currently breastfeeding. Physical Examination:  General appearance - alert, well appearing, and in no distress Chest - normal effort Abdomen - gravid, non-tender Fundal Height:  size equals dates Extremities: Homans sign is negative, no sign of DVT  Membranes:intact  Fetal Monitoring:  Baseline: 140 bpm, Variability: Good {> 6 bpm), Accelerations: Non-reactive but appropriate for gestational age, and Decelerations: Absent  Labs:  Results for orders placed or performed during the hospital encounter of 01/27/22 (from the past 24 hour(s))  Urinalysis, Routine w reflex microscopic Urine, Clean Catch   Collection Time: 01/27/22 11:30 AM  Result  Value Ref Range   Color, Urine YELLOW YELLOW   APPearance CLEAR CLEAR   Specific Gravity, Urine 1.020 1.005 - 1.030   pH 6.5 5.0 - 8.0   Glucose, UA NEGATIVE NEGATIVE mg/dL   Hgb urine dipstick MODERATE (A) NEGATIVE   Bilirubin Urine NEGATIVE NEGATIVE   Ketones, ur 15 (A) NEGATIVE mg/dL   Protein, ur NEGATIVE NEGATIVE mg/dL   Nitrite NEGATIVE NEGATIVE   Leukocytes,Ua NEGATIVE NEGATIVE  Urinalysis, Microscopic (reflex)   Collection Time: 01/27/22 11:30 AM  Result Value Ref Range   RBC / HPF 21-50 0 - 5 RBC/hpf   WBC, UA 0-5 0 - 5 WBC/hpf   Bacteria, UA FEW (A) NONE SEEN   Squamous Epithelial / LPF 6-10 0 - 5   Mucus PRESENT   CBC with Differential/Platelet   Collection Time: 01/27/22 12:20 PM  Result Value Ref Range   WBC 25.4 (H) 4.0 - 10.5 K/uL   RBC 3.58 (L) 3.87 - 5.11 MIL/uL   Hemoglobin 11.9 (L) 12.0 - 15.0 g/dL   HCT 03/27/22 (L) 99.8 - 33.8 %   MCV 95.3 80.0 - 100.0 fL   MCH 33.2 26.0 - 34.0 pg   MCHC 34.9 30.0 - 36.0 g/dL   RDW 25.0 53.9 - 76.7 %   Platelets 415 (H) 150 - 400 K/uL   nRBC 0.0 0.0 - 0.2 %   Neutrophils Relative % 84 %   Neutro Abs 21.3 (H) 1.7 - 7.7 K/uL   Lymphocytes Relative 9 %   Lymphs Abs 2.3 0.7 - 4.0 K/uL   Monocytes Relative 5 %   Monocytes Absolute 1.3 (H) 0.1 - 1.0 K/uL   Eosinophils Relative 1 %  Eosinophils Absolute 0.2 0.0 - 0.5 K/uL   Basophils Relative 0 %   Basophils Absolute 0.1 0.0 - 0.1 K/uL   WBC Morphology MORPHOLOGY UNREMARKABLE    RBC Morphology MORPHOLOGY UNREMARKABLE    Smear Review MORPHOLOGY UNREMARKABLE    Immature Granulocytes 1 %   Abs Immature Granulocytes 0.28 (H) 0.00 - 0.07 K/uL  Comprehensive metabolic panel   Collection Time: 01/27/22 12:20 PM  Result Value Ref Range   Sodium 134 (L) 135 - 145 mmol/L   Potassium 3.9 3.5 - 5.1 mmol/L   Chloride 104 98 - 111 mmol/L   CO2 22 22 - 32 mmol/L   Glucose, Bld 96 70 - 99 mg/dL   BUN 6 6 - 20 mg/dL   Creatinine, Ser 9.17 0.44 - 1.00 mg/dL   Calcium 9.2 8.9 - 91.5  mg/dL   Total Protein 6.0 (L) 6.5 - 8.1 g/dL   Albumin 2.6 (L) 3.5 - 5.0 g/dL   AST 9 (L) 15 - 41 U/L   ALT 14 0 - 44 U/L   Alkaline Phosphatase 96 38 - 126 U/L   Total Bilirubin 0.3 0.3 - 1.2 mg/dL   GFR, Estimated >05 >69 mL/min   Anion gap 8 5 - 15  Resp Panel by RT-PCR (Flu A&B, Covid) Nasopharyngeal Swab   Collection Time: 01/27/22  2:31 PM   Specimen: Nasopharyngeal Swab; Nasopharyngeal(NP) swabs in vial transport medium  Result Value Ref Range   SARS Coronavirus 2 by RT PCR NEGATIVE NEGATIVE   Influenza A by PCR NEGATIVE NEGATIVE   Influenza B by PCR NEGATIVE NEGATIVE  Type and screen [facility]   Collection Time: 01/27/22  2:31 PM  Result Value Ref Range   ABO/RH(D) A NEG    Antibody Screen NEG    Sample Expiration      01/30/2022,2359 Performed at [facility], [address], [place], [place] [address]     Imaging Studies:    US Renal  Result Date: 01/27/2022 CLINICAL DATA:  Left flank pain.  Twenty-six weeks pregnant. EXAM: RENAL / URINARY TRACT ULTRASOUND COMPLETE COMPARISON:  CT abdomen and pelvis 03/21/2011. FINDINGS: Right Kidney: Renal measurements: 11.9 x 5.6 x 5.6 cm = volume: 197 mL. Echogenicity within normal limits. No mass or hydronephrosis visualized. Left Kidney: Renal measurements: 12.5 x 5.3 x 6.5 cm = volume: 127 mL. Echogenicity within normal limits. Echogenic 10 mm shadowing likely stone is seen within the mid to inferior left kidney. No mass or hydronephrosis visualized. Bladder: Appears normal for degree of bladder distention. Other: None. IMPRESSION: Non-obstructing left renal stone measuring up to 10 mm. No hydronephrosis within either kidney. Electronically Signed   By: Neita Garnet M.D.   On: 01/27/2022 13:59     Medications:  Scheduled  docusate sodium  100 mg Oral Daily   HYDROmorphone   Intravenous Q4H   prenatal multivitamin  1 tablet Oral Q1200   tamsulosin  0.4 mg Oral Daily   I have reviewed the patient's current  medications.   Reva Bores, MD 01/28/2022,11:01 AM

## 2022-01-29 DIAGNOSIS — O26832 Pregnancy related renal disease, second trimester: Secondary | ICD-10-CM | POA: Diagnosis not present

## 2022-01-29 LAB — COMPREHENSIVE METABOLIC PANEL
ALT: 13 U/L (ref 0–44)
AST: 10 U/L — ABNORMAL LOW (ref 15–41)
Albumin: 2.1 g/dL — ABNORMAL LOW (ref 3.5–5.0)
Alkaline Phosphatase: 74 U/L (ref 38–126)
Anion gap: 7 (ref 5–15)
BUN: 5 mg/dL — ABNORMAL LOW (ref 6–20)
CO2: 25 mmol/L (ref 22–32)
Calcium: 8.8 mg/dL — ABNORMAL LOW (ref 8.9–10.3)
Chloride: 105 mmol/L (ref 98–111)
Creatinine, Ser: 0.59 mg/dL (ref 0.44–1.00)
GFR, Estimated: >60 mL/min (ref >60)
Glucose, Bld: 115 mg/dL — ABNORMAL HIGH (ref 70–99)
Potassium: 3.8 mmol/L (ref 3.5–5.1)
Sodium: 137 mmol/L (ref 135–145)
Total Bilirubin: 0.2 mg/dL — ABNORMAL LOW (ref 0.3–1.2)
Total Protein: 5.1 g/dL — ABNORMAL LOW (ref 6.5–8.1)

## 2022-01-29 LAB — CBC
HCT: 30.4 % — ABNORMAL LOW (ref 36.0–46.0)
Hemoglobin: 10 g/dL — ABNORMAL LOW (ref 12.0–15.0)
MCH: 31.9 pg (ref 26.0–34.0)
MCHC: 32.9 g/dL (ref 30.0–36.0)
MCV: 97.1 fL (ref 80.0–100.0)
Platelets: 379 10*3/uL (ref 150–400)
RBC: 3.13 MIL/uL — ABNORMAL LOW (ref 3.87–5.11)
RDW: 14.4 % (ref 11.5–15.5)
WBC: 15.8 10*3/uL — ABNORMAL HIGH (ref 4.0–10.5)
nRBC: 0 % (ref 0.0–0.2)

## 2022-01-29 NOTE — Progress Notes (Signed)
Patient ID: Cassandra Roth, female   DOB: 06/25/1981, 41 y.o.   MRN: [id number] FACULTY PRACTICE ANTEPARTUM(COMPREHENSIVE) NOTE  Cassandra Roth is a 41 y.o. G3P2002 at [redacted]w[redacted]d by best clinical estimate who is admitted for nephrolithiasis.   Fetal presentation is unsure. Length of Stay:  0  Days  ASSESSMENT: Principal Problem:   Nephrolithiasis Active Problems:   Leukocytosis   Cigarette nicotine dependence without complication   PLAN: Nephrolithiasis PCA - pain is fairly well controlled, though she has not had any significant improvement No evidence of hydronephrosis on renal u/s--has needed a stent in the past. IVF - to improve flow and hope to pass stone Strain urine - has passed some gravel Flomax Antiemetics prn   Leukocytosis Unclear etiology Repeat today Check urine Culture   Tobacco Dependence On Nicotine patch   AMA On Baby ASA  Subjective: No significant improvement despite 2 days of PCA and IVF Patient reports the fetal movement as active. Patient reports uterine contraction  activity as none. Patient reports  vaginal bleeding as none. Patient describes fluid per vagina as None.  Vitals:  Blood pressure 111/60, pulse 98, temperature 98 F (36.7 C), temperature source Oral, resp. rate 13, height 5\' 3"  (1.6 m), weight 94 kg, last menstrual period 07/29/2021, SpO2 100 %, unknown if currently breastfeeding. Physical Examination:  General appearance - alert, well appearing, and in no distress Chest - normal effort Abdomen - gravid, non-tender Fundal Height:  size equals dates Extremities: Homans sign is negative, no sign of DVT  Membranes:intact  Fetal Monitoring:  Baseline: 150 bpm, Variability: Good {> 6 bpm), Accelerations: Non-reactive but appropriate for gestational age, and Decelerations: Absent  Labs:  Results for orders placed or performed during the hospital encounter of 01/27/22 (from the past 24 hour(s))  CBC     Status: Abnormal   Collection Time:  01/29/22 10:35 AM  Result Value Ref Range   WBC 15.8 (H) 4.0 - 10.5 K/uL   RBC 3.13 (L) 3.87 - 5.11 MIL/uL   Hemoglobin 10.0 (L) 12.0 - 15.0 g/dL   HCT 03/29/22 (L) 35.5 - 73.2 %   MCV 97.1 80.0 - 100.0 fL   MCH 31.9 26.0 - 34.0 pg   MCHC 32.9 30.0 - 36.0 g/dL   RDW 20.2 54.2 - 70.6 %   Platelets 379 150 - 400 K/uL   nRBC 0.0 0.0 - 0.2 %  Comprehensive metabolic panel     Status: Abnormal   Collection Time: 01/29/22 10:35 AM  Result Value Ref Range   Sodium 137 135 - 145 mmol/L   Potassium 3.8 3.5 - 5.1 mmol/L   Chloride 105 98 - 111 mmol/L   CO2 25 22 - 32 mmol/L   Glucose, Bld 115 (H) 70 - 99 mg/dL   BUN 5 (L) 6 - 20 mg/dL   Creatinine, Ser 03/29/22 0.44 - 1.00 mg/dL   Calcium 8.8 (L) 8.9 - 10.3 mg/dL   Total Protein 5.1 (L) 6.5 - 8.1 g/dL   Albumin 2.1 (L) 3.5 - 5.0 g/dL   AST 10 (L) 15 - 41 U/L   ALT 13 0 - 44 U/L   Alkaline Phosphatase 74 38 - 126 U/L   Total Bilirubin 0.2 (L) 0.3 - 1.2 mg/dL   GFR, Estimated 6.28 >31 mL/min   Anion gap 7 5 - 15     Imaging Studies:    >51 Renal  Result Date: 01/27/2022 CLINICAL DATA:  Left flank pain.  Twenty-six weeks pregnant. EXAM: RENAL /  URINARY TRACT ULTRASOUND COMPLETE COMPARISON:  CT abdomen and pelvis 03/21/2011. FINDINGS: Right Kidney: Renal measurements: 11.9 x 5.6 x 5.6 cm = volume: 197 mL. Echogenicity within normal limits. No mass or hydronephrosis visualized. Left Kidney: Renal measurements: 12.5 x 5.3 x 6.5 cm = volume: 127 mL. Echogenicity within normal limits. Echogenic 10 mm shadowing likely stone is seen within the mid to inferior left kidney. No mass or hydronephrosis visualized. Bladder: Appears normal for degree of bladder distention. Other: None. IMPRESSION: Non-obstructing left renal stone measuring up to 10 mm. No hydronephrosis within either kidney. Electronically Signed   By: Neita Garnet M.D.   On: 01/27/2022 13:59     Medications:  Scheduled  docusate sodium  100 mg Oral Daily   HYDROmorphone   Intravenous Q4H    nicotine  21 mg Transdermal Daily   prenatal multivitamin  1 tablet Oral Q1200   tamsulosin  0.4 mg Oral Daily   I have reviewed the patient's current medications.   Reva Bores, MD 01/29/2022,10:25 AM

## 2022-01-30 DIAGNOSIS — Z3A26 26 weeks gestation of pregnancy: Secondary | ICD-10-CM | POA: Diagnosis not present

## 2022-01-30 DIAGNOSIS — N200 Calculus of kidney: Secondary | ICD-10-CM | POA: Diagnosis not present

## 2022-01-30 DIAGNOSIS — O26832 Pregnancy related renal disease, second trimester: Secondary | ICD-10-CM | POA: Diagnosis not present

## 2022-01-30 LAB — CULTURE, OB URINE

## 2022-01-30 NOTE — Progress Notes (Signed)
Patient ID: Cassandra Roth, female   DOB: 1981-10-30, 41 y.o.   MRN: [id number] [place]) NOTE  Cassandra Roth is a 41 y.o. G3P2002 at [redacted]w[redacted]d by best clinical estimate who is admitted for nephrolithiasis.   Fetal presentation is unsure. Length of Stay:  0  Days  ASSESSMENT: Principal Problem:   Nephrolithiasis Active Problems:   Leukocytosis   Cigarette nicotine dependence without complication   PLAN: Nephrolithiasis PCA - pain is fairly well controlled, she reports improved pain today No evidence of hydronephrosis on renal u/s--has needed a stent in the past. Will attempt to turn off her PCA tomorrow IVF - to improve flow and hope to pass stone Strain urine - has passed some gravel Flomax Antiemetics prn   Leukocytosis Unclear etiology Repeat today Check urine Culture   Tobacco Dependence On Nicotine patch   AMA On Baby ASA  Subjective: Feels better. Pain is no longer as sharp and she has continued to pass some gravel. Having some LE edema. Patient reports the fetal movement as active. Patient reports uterine contraction  activity as none. Patient reports  vaginal bleeding as none. Patient describes fluid per vagina as None.  Vitals:  Blood pressure 106/66, pulse 91, temperature 98.2 F (36.8 C), temperature source Oral, resp. rate 15, height 5\' 3"  (1.6 m), weight 94 kg, last menstrual period 07/29/2021, SpO2 98 %, unknown if currently breastfeeding. Physical Examination:  General appearance - alert, well appearing, and in no distress Chest - normal effort Abdomen - gravid, non-tender Fundal Height:  size equals dates Extremities: Homans sign is negative, no sign of DVT  Membranes:intact  Fetal Monitoring:  Baseline: 145 bpm, Variability: Good {> 6 bpm), Accelerations: Non-reactive but appropriate for gestational age, and Decelerations: Absent  Labs:  No results found for this or any previous visit (from the past 24  hour(s)).  Imaging Studies:    US Renal  Result Date: 01/27/2022 CLINICAL DATA:  Left flank pain.  Twenty-six weeks pregnant. EXAM: RENAL / URINARY TRACT ULTRASOUND COMPLETE COMPARISON:  CT abdomen and pelvis 03/21/2011. FINDINGS: Right Kidney: Renal measurements: 11.9 x 5.6 x 5.6 cm = volume: 197 mL. Echogenicity within normal limits. No mass or hydronephrosis visualized. Left Kidney: Renal measurements: 12.5 x 5.3 x 6.5 cm = volume: 127 mL. Echogenicity within normal limits. Echogenic 10 mm shadowing likely stone is seen within the mid to inferior left kidney. No mass or hydronephrosis visualized. Bladder: Appears normal for degree of bladder distention. Other: None. IMPRESSION: Non-obstructing left renal stone measuring up to 10 mm. No hydronephrosis within either kidney. Electronically Signed   By: Yvonne Kendall M.D.   On: 01/27/2022 13:59     Medications:  Scheduled  docusate sodium  100 mg Oral Daily   HYDROmorphone   Intravenous Q4H   nicotine  21 mg Transdermal Daily   prenatal multivitamin  1 tablet Oral Q1200   tamsulosin  0.4 mg Oral Daily   I have reviewed the patient's current medications.   Donnamae Jude, MD 01/30/2022,12:38 PM

## 2022-01-31 ENCOUNTER — Inpatient Hospital Stay (HOSPITAL_COMMUNITY): Payer: Commercial Managed Care - HMO

## 2022-01-31 ENCOUNTER — Ambulatory Visit: Payer: Medicaid Other | Admitting: Cardiology

## 2022-01-31 DIAGNOSIS — O26832 Pregnancy related renal disease, second trimester: Secondary | ICD-10-CM | POA: Diagnosis not present

## 2022-01-31 NOTE — Progress Notes (Addendum)
Patient ID: Cassandra Roth, female   DOB: [birth year], 41 y.o.   MRN: [id number] FACULTY PRACTICE ANTEPARTUM(COMPREHENSIVE) NOTE  NANA VASTINE is a 41 y.o. G3P2002 at [redacted]w[redacted]d by best clinical estimate who is admitted for nephrolithiasis.   Fetal presentation is unsure. Length of Stay:  0  Days  ASSESSMENT: Principal Problem:   Nephrolithiasis Active Problems:   Leukocytosis   Cigarette nicotine dependence without complication   PLAN: Nephrolithiasis PCA - pain is still not well controlled, "had a rough night last night" No evidence of hydronephrosis on renal u/s--has needed a stent in the past. Check CT per Urology Urology consult today IVF - to improve flow and hope to pass stone Strain urine - has passed some gravel Flomax Antiemetics prn   Leukocytosis Unclear etiology Repeat today Check urine Culture--Negative   Tobacco Dependence On Nicotine patch   AMA On Baby ASA  Subjective: Still feeling quite uncomfortable. Has stabbing pain in her left side. Patient reports the fetal movement as active. Patient reports uterine contraction  activity as none. Patient reports  vaginal bleeding as none. Patient describes fluid per vagina as None.  Vitals:  Blood pressure 117/61, pulse 81, temperature 98.4 F (36.9 C), temperature source Oral, resp. rate 14, height 5\' 3"  (1.6 m), weight 94 kg, last menstrual period 07/29/2021, SpO2 98 %, unknown if currently breastfeeding. Physical Examination:  General appearance - alert, well appearing, and in no distress Chest - normal effort Abdomen - gravid, non-tender Back - Left CVA tenderness Fundal Height:  size equals dates Extremities: Homans sign is negative, no sign of DVT  Membranes:intact  Fetal Monitoring:  Baseline: 135 bpm, Variability: Good {> 6 bpm), Accelerations: Non-reactive but appropriate for gestational age, and Decelerations: Absent  Labs:  No results found for this or any previous visit (from the past 24  hour(s)).  Imaging Studies:    09/28/2021 Renal  Result Date: 01/27/2022 CLINICAL DATA:  Left flank pain.  Twenty-six weeks pregnant. EXAM: RENAL / URINARY TRACT ULTRASOUND COMPLETE COMPARISON:  CT abdomen and pelvis 03/21/2011. FINDINGS: Right Kidney: Renal measurements: 11.9 x 5.6 x 5.6 cm = volume: 197 mL. Echogenicity within normal limits. No mass or hydronephrosis visualized. Left Kidney: Renal measurements: 12.5 x 5.3 x 6.5 cm = volume: 127 mL. Echogenicity within normal limits. Echogenic 10 mm shadowing likely stone is seen within the mid to inferior left kidney. No mass or hydronephrosis visualized. Bladder: Appears normal for degree of bladder distention. Other: None. IMPRESSION: Non-obstructing left renal stone measuring up to 10 mm. No hydronephrosis within either kidney. Electronically Signed   By: 03/23/2011 M.D.   On: 01/27/2022 13:59     Medications:  Scheduled  docusate sodium  100 mg Oral Daily   HYDROmorphone   Intravenous Q4H   nicotine  21 mg Transdermal Daily   prenatal multivitamin  1 tablet Oral Q1200   tamsulosin  0.4 mg Oral Daily   I have reviewed the patient's current medications.   03/27/2022, MD 01/31/2022,10:43 AM

## 2022-01-31 NOTE — Consult Note (Addendum)
Urology Consult Note   Requesting Attending Physician:  [name], DO Service Providing Consult: Urology  Consulting Attending: [name], MD   Reason for Consult: Flank pain  HPI: Cassandra Roth is seen in consultation for reasons noted above at the request of [name], DO for evaluation of flank pain in the setting of nephrolithiasis.  This is a 41 y.o. female with G3, P2 who is about [redacted] weeks pregnant who has been hospitalized on the obstetrics service due to presenting with left flank pain and nausea.  She presented with a white blood cell count of 22,000 and urinalysis concerning for infection.  Urine culture has grown mixed species.  Renal ultrasound obtained at time of admission revealed nonobstructing left nephrolithiasis and no hydronephrosis.  Patient continued to have left flank pain and a CT scan was obtained today again confirming nonobstructing left nephrolithiasis.  Patient states that she has had stones in the past and they were treated at [facility].  This was at least 13 years ago.  She is not currently following with urology.  She denies dysuria, gross hematuria, other lower urinary tract symptoms.   Past Medical History: Past Medical History:  Diagnosis Date   Anxiety    Back pain    Migraine    Ovarian cyst    Seizures ([place])    after taking zoloft    Past Surgical History:  Past Surgical History:  Procedure Laterality Date   OVARIAN CYST REMOVAL      Medication: Current Facility-Administered Medications  Medication Dose Route Frequency Provider Last Rate Last Admin   acetaminophen (TYLENOL) tablet 650 mg  650 mg Oral Q4H PRN Marcille Buffy D, CNM   650 mg at 01/30/22 2322   diphenhydrAMINE (BENADRYL) injection 12.5 mg  12.5 mg Intravenous Q6H PRN Tresea Mall, CNM       Or   diphenhydrAMINE (BENADRYL) 12.5 MG/5ML elixir 12.5 mg  12.5 mg Oral Q6H PRN Tresea Mall, CNM       docusate sodium (COLACE) capsule 100 mg  100 mg Oral  Daily Marcille Buffy D, CNM   100 mg at 01/31/22 0931   HYDROmorphone (DILAUDID) 1 mg/mL PCA injection   Intravenous Q4H Marcille Buffy D, CNM   Syringe Replaced at 01/30/22 2352   lactated ringers infusion   Intravenous Continuous Donnamae Jude, MD 150 mL/hr at 01/31/22 1014 New Bag at 01/31/22 1014   naloxone (NARCAN) injection 0.4 mg  0.4 mg Intravenous PRN Tresea Mall, CNM       And   sodium chloride flush (NS) 0.9 % injection 9 mL  9 mL Intravenous PRN Marcille Buffy D, CNM       nicotine (NICODERM CQ - dosed in mg/24 hours) patch 21 mg  21 mg Transdermal Daily Donnamae Jude, MD   21 mg at 01/31/22 0936   ondansetron (ZOFRAN) injection 4 mg  4 mg Intravenous Q6H PRN Marcille Buffy D, CNM   4 mg at 01/31/22 0944   prenatal multivitamin tablet 1 tablet  1 tablet Oral Q1200 Marcille Buffy D, CNM   1 tablet at 01/31/22 1144   tamsulosin (FLOMAX) capsule 0.4 mg  0.4 mg Oral Daily Marcille Buffy D, CNM   0.4 mg at 01/31/22 0931   zolpidem (AMBIEN) tablet 5 mg  5 mg Oral QHS PRN Marcille Buffy D, CNM   5 mg at 01/30/22 2322    Allergies: Allergies  Allergen Reactions   Zoloft [Sertraline Hcl]  Other (See Comments)    Seizure    Social History: Social History   Tobacco Use   Smoking status: Every Day    Packs/day: 0.50    Types: Cigarettes   Smokeless tobacco: Never  Vaping Use   Vaping Use: Never used  Substance Use Topics   Alcohol use: Not Currently    Comment: not since confirmed pregnancy   Drug use: Yes    Types: Other-see comments    Comment: stopped methadone 3 weeks ago    Family History Family History  Problem Relation Age of Onset   Hypertension Mother    Diabetes Mother    Kidney cancer Mother    Throat cancer Maternal Grandmother    Dementia Maternal Grandmother     Review of Systems 10 systems were reviewed and are negative except as noted specifically in the HPI.  Objective   Vital signs in last 24 hours: BP 104/65 (BP Location: Left Arm)     Pulse 87    Temp 98.1 F (36.7 C) (Oral)    Resp 11    Ht 5\' 3"  (1.6 m)    Wt 94 kg    LMP 07/29/2021    SpO2 100%    Breastfeeding Unknown    BMI 36.70 kg/m   Physical Exam General: NAD, A&O, resting, appropriate HEENT: [place]/AT, EOMI, MMM Pulmonary: Normal work of breathing Cardiovascular: HDS, adequate peripheral perfusion Abdomen: Soft, NTTP, nondistended GU: Voiding spontaneously, mild left CVA tenderness Extremities: warm and well perfused Neuro: Appropriate, no focal neurological deficits  Most Recent Labs: Lab Results  Component Value Date   WBC 15.8 (H) 01/29/2022   HGB 10.0 (L) 01/29/2022   HCT 30.4 (L) 01/29/2022   PLT 379 01/29/2022    Lab Results  Component Value Date   NA 137 01/29/2022   K 3.8 01/29/2022   CL 105 01/29/2022   CO2 25 01/29/2022   BUN 5 (L) 01/29/2022   CREATININE 0.59 01/29/2022   CALCIUM 8.8 (L) 01/29/2022    No results found for: INR, APTT   Urine Culture: @[id number] (laburin,org,[id number])@   IMAGING: CT ABDOMEN PELVIS WO CONTRAST  Result Date: 01/31/2022 CLINICAL DATA:  "Nephrolithiasis". Twenty-six weeks pregnant. Left flank pain. EXAM: CT ABDOMEN AND PELVIS WITHOUT CONTRAST TECHNIQUE: Multidetector CT imaging of the abdomen and pelvis was performed following the standard protocol without IV contrast. RADIATION DOSE REDUCTION: This exam was performed according to the departmental dose-optimization program which includes automated exposure control, adjustment of the mA and/or kV according to patient size and/or use of iterative reconstruction technique. COMPARISON:  Renal ultrasound of 01/27/2022. Most recent abdominopelvic CT of 03/01/2011. FINDINGS: Lower chest: Clear lung bases. Normal heart size without pericardial or pleural effusion. Hepatobiliary: Normal liver. Normal gallbladder, without biliary ductal dilatation. Pancreas: Normal, without mass or ductal dilatation. Spleen: Normal in size, without focal abnormality. Adrenals/Urinary  Tract: Normal adrenal glands. Multiple left renal collecting system calculi, including at up to 4 mm in the upper pole. No hydronephrosis. Ureters are difficult to follow, but no hydroureter or ureteric calculi are seen. Stomach/Bowel: Normal stomach, without wall thickening. Colonic stool burden suggests constipation. Normal small bowel. Normal terminal ileum and appendix, both positioned anteriorly. Example appendix on 55/3. Vascular/Lymphatic: Aortic atherosclerosis. No abdominopelvic adenopathy. Reproductive: Intrauterine pregnancy. Cephalic position with anterior placenta. No adnexal mass. Other: No significant free fluid.  No free intraperitoneal air. Musculoskeletal: Degenerate disc disease at the lumbosacral junction. IMPRESSION: 1. Left nephrolithiasis, without obstructive uropathy. 2.  Possible constipation. 3. Normal appendix. 4.  Aortic Atherosclerosis (ICD10-I70.0). Electronically Signed   By: Abigail Miyamoto M.D.   On: 01/31/2022 15:55    ------  Assessment:  41 y.o. female G3, P2, [redacted] weeks pregnant who is hospital day 4 on the obstetrics service presenting for left flank pain.  Afebrile hemodynamically stable.  Renal ultrasound on 01/27/22 revealing nonobstructing left renal stone and no hydronephrosis.  Continues to have pain.  Urine culture with mixed species.  Leukocytosis 25,000 presentation now down to 16,000.  Creatinine stable at 0.59.  Continue cefepime.  Noncontrasted low-dose CT scan 01/31/2022 with again noted nonobstructive left-sided stones without hydronephrosis.  Recommend conservative management and urology follow-up after delivery.  No indication for urologic intervention at this time as there is no obstructive uropathy.  Recommend treating the patient's UTI if indicated per obstetrics guidelines as her urine culture was positive and pyelonephritis can certainly present with flank pain.  Recommendations: -No urologic intervention indicated at this time -UTI treatment per  primary team if indicated per obstetrics guidelines -Patient should follow-up with urology after delivery with renal ultrasound  Thank you for this consult. Please contact the urology consult pager with any further questions/concerns.  I have interviewed the patient, reviewed all applicable studies and discussed this with Dr Danise Edge.  I agree with his assessment and plan.

## 2022-01-31 NOTE — Progress Notes (Signed)
Att approximately 0330, RN noticed low SpO2 of 91% on monitor. RN checked on patient who was sleeping with a respiratory rate of 13. SpO2 ranged from 91-96 in the 5 minute period the RN was in the room. RN placed Patient on Nasal Cannula 2L/min. SpO2 increased to 97%. Raelyn Ensign, RN

## 2022-02-01 DIAGNOSIS — Z3A26 26 weeks gestation of pregnancy: Secondary | ICD-10-CM | POA: Diagnosis not present

## 2022-02-01 DIAGNOSIS — N200 Calculus of kidney: Secondary | ICD-10-CM | POA: Diagnosis not present

## 2022-02-01 DIAGNOSIS — O26832 Pregnancy related renal disease, second trimester: Secondary | ICD-10-CM | POA: Diagnosis not present

## 2022-02-01 MED ORDER — DOCUSATE SODIUM 100 MG PO CAPS: 100.0000 mg | ORAL_CAPSULE | Freq: Every day | ORAL | 0 refills | Status: DC

## 2022-02-01 MED ORDER — ONDANSETRON 4 MG PO TBDP: 4.0000 mg | ORAL_TABLET | Freq: Three times a day (TID) | ORAL | 0 refills | Status: DC | PRN

## 2022-02-01 MED ORDER — ONDANSETRON 4 MG PO TBDP: 4.0000 mg | ORAL_TABLET | Freq: Three times a day (TID) | ORAL | Status: DC | PRN

## 2022-02-01 MED ORDER — TAMSULOSIN HCL 0.4 MG PO CAPS: 0.4000 mg | ORAL_CAPSULE | Freq: Every day | ORAL | 1 refills | Status: DC

## 2022-02-01 MED ORDER — OXYCODONE HCL 5 MG PO TABS
5.0000 mg | ORAL_TABLET | Freq: Four times a day (QID) | ORAL | Status: DC | PRN
  Administered 2022-02-01: 5 mg via ORAL
  Filled 2022-02-01: qty 1

## 2022-02-01 MED ORDER — NICOTINE 21 MG/24HR TD PT24: 21.0000 mg | MEDICATED_PATCH | Freq: Every day | TRANSDERMAL | 0 refills | Status: DC

## 2022-02-01 MED ORDER — OXYCODONE HCL 5 MG PO TABS: 5.0000 mg | ORAL_TABLET | Freq: Four times a day (QID) | ORAL | 0 refills | Status: AC | PRN

## 2022-02-01 MED ORDER — CEPHALEXIN 500 MG PO CAPS: 500.0000 mg | ORAL_CAPSULE | Freq: Two times a day (BID) | ORAL | 0 refills | Status: DC

## 2022-02-01 MED ORDER — ACETAMINOPHEN 325 MG PO TABS: 650.0000 mg | ORAL_TABLET | ORAL | Status: AC | PRN

## 2022-02-01 NOTE — Progress Notes (Signed)
Per MD, PCA turned off. Dilaudid wasted by Jonathon Resides RN; verified by Geraldo Pitter RN. Wasted amount was 1ml into stericycle. Danie Binder, RN

## 2022-02-01 NOTE — Discharge Summary (Signed)
Antenatal Physician Discharge Summary  Patient ID: Cassandra Roth MRN: [id number] DOB/AGE: 1981/10/05 41 y.o.  Admit date: 01/27/2022 Discharge date: 02/01/2022  Admission Diagnoses: Principal Problem:   Nephrolithiasis Active Problems:   Leukocytosis   Cigarette nicotine dependence without complication   Discharge Diagnoses: Same  Prenatal Procedures: none  Consults: Neonatology, Maternal Fetal Medicine  Hospital Course:  Cassandra Roth is a 41 y.o. [phone] with IUP at [redacted]w[redacted]d admitted for pain control in setting of nephrolithiasis.   No leaking of fluid and no bleeding.  She was initially started on IVF, PCA strained her urine, checked urine culture which was negative and placed on Flomax.  She was observed, fetal heart rate monitoring remained reassuring, and she had not improved. She had a CT and was seen by Urology who offered very little. No evidence of obstruction. Her PCA was turned off and pain managed with oxycodone, tylenol and heat. She was treated for presumed UTI, though culture was negative, per Urology due to leukocytosis. She was deemed stable for discharge to home with outpatient follow up.  Discharge Exam: Temp:  [97.6 F (36.4 C)-98.5 F (36.9 C)] 98.5 F (36.9 C) (02/11 0830) Pulse Rate:  [85-94] 85 (02/11 0830) Resp:  [8-18] 16 (02/11 0830) BP: (96-119)/(49-91) 119/53 (02/11 0830) SpO2:  [95 %-100 %] 96 % (02/11 0900) Physical Examination: CONSTITUTIONAL: Well-developed, well-nourished female in no acute distress.  HENT:  Normocephalic, atraumatic, External right and left ear normal.  EYES: Conjunctivae and EOM are normal. No scleral icterus.  NECK: Normal range of motion, supple, no masses SKIN: Skin is warm and dry. No rash noted.  NEUROLOGIC: Alert and oriented to person, place, and time. PSYCHIATRIC: Normal mood and affect. Normal behavior. Normal judgment and thought content. CARDIOVASCULAR: Normal heart rate noted, regular rhythm RESPIRATORY: Effort and  breath sounds normal MUSCULOSKELETAL: Normal range of motion. No edema and no tenderness. 2+ distal pulses. ABDOMEN: Soft, nontender, nondistended, gravid.   Fetal monitoring: FHR: 140 bpm, Variability: moderate, Accelerations: Present, Decelerations: Absent  Uterine activity: quiet   Significant Diagnostic Studies:  Results for orders placed or performed during the hospital encounter of 01/27/22 (from the past 168 hour(s))  Urinalysis, Routine w reflex microscopic Urine, Clean Catch   Collection Time: 01/27/22 11:30 AM  Result Value Ref Range   Color, Urine YELLOW YELLOW   APPearance CLEAR CLEAR   Specific Gravity, Urine 1.020 1.005 - 1.030   pH 6.5 5.0 - 8.0   Glucose, UA NEGATIVE NEGATIVE mg/dL   Hgb urine dipstick MODERATE (A) NEGATIVE   Bilirubin Urine NEGATIVE NEGATIVE   Ketones, ur 15 (A) NEGATIVE mg/dL   Protein, ur NEGATIVE NEGATIVE mg/dL   Nitrite NEGATIVE NEGATIVE   Leukocytes,Ua NEGATIVE NEGATIVE  Urinalysis, Microscopic (reflex)   Collection Time: 01/27/22 11:30 AM  Result Value Ref Range   RBC / HPF 21-50 0 - 5 RBC/hpf   WBC, UA 0-5 0 - 5 WBC/hpf   Bacteria, UA FEW (A) NONE SEEN   Squamous Epithelial / LPF 6-10 0 - 5   Mucus PRESENT   CBC with Differential/Platelet   Collection Time: 01/27/22 12:20 PM  Result Value Ref Range   WBC 25.4 (H) 4.0 - 10.5 K/uL   RBC 3.58 (L) 3.87 - 5.11 MIL/uL   Hemoglobin 11.9 (L) 12.0 - 15.0 g/dL   HCT 34.1 (L) 36.0 - 46.0 %   MCV 95.3 80.0 - 100.0 fL   MCH 33.2 26.0 - 34.0 pg   MCHC 34.9 30.0 - 36.0 g/dL  RDW 14.1 11.5 - 15.5 %   Platelets 415 (H) 150 - 400 K/uL   nRBC 0.0 0.0 - 0.2 %   Neutrophils Relative % 84 %   Neutro Abs 21.3 (H) 1.7 - 7.7 K/uL   Lymphocytes Relative 9 %   Lymphs Abs 2.3 0.7 - 4.0 K/uL   Monocytes Relative 5 %   Monocytes Absolute 1.3 (H) 0.1 - 1.0 K/uL   Eosinophils Relative 1 %   Eosinophils Absolute 0.2 0.0 - 0.5 K/uL   Basophils Relative 0 %   Basophils Absolute 0.1 0.0 - 0.1 K/uL   WBC  Morphology MORPHOLOGY UNREMARKABLE    RBC Morphology MORPHOLOGY UNREMARKABLE    Smear Review MORPHOLOGY UNREMARKABLE    Immature Granulocytes 1 %   Abs Immature Granulocytes 0.28 (H) 0.00 - 0.07 K/uL  Comprehensive metabolic panel   Collection Time: 01/27/22 12:20 PM  Result Value Ref Range   Sodium 134 (L) 135 - 145 mmol/L   Potassium 3.9 3.5 - 5.1 mmol/L   Chloride 104 98 - 111 mmol/L   CO2 22 22 - 32 mmol/L   Glucose, Bld 96 70 - 99 mg/dL   BUN 6 6 - 20 mg/dL   Creatinine, Ser 2.15 0.44 - 1.00 mg/dL   Calcium 9.2 8.9 - 13.1 mg/dL   Total Protein 6.0 (L) 6.5 - 8.1 g/dL   Albumin 2.6 (L) 3.5 - 5.0 g/dL   AST 9 (L) 15 - 41 U/L   ALT 14 0 - 44 U/L   Alkaline Phosphatase 96 38 - 126 U/L   Total Bilirubin 0.3 0.3 - 1.2 mg/dL   GFR, Estimated >76 >63 mL/min   Anion gap 8 5 - 15  Resp Panel by RT-PCR (Flu A&B, Covid) Nasopharyngeal Swab   Collection Time: 01/27/22  2:31 PM   Specimen: Nasopharyngeal Swab; Nasopharyngeal(NP) swabs in vial transport medium  Result Value Ref Range   SARS Coronavirus 2 by RT PCR NEGATIVE NEGATIVE   Influenza A by PCR NEGATIVE NEGATIVE   Influenza B by PCR NEGATIVE NEGATIVE  Type and screen [facility]   Collection Time: 01/27/22  2:31 PM  Result Value Ref Range   ABO/RH(D) A NEG    Antibody Screen NEG    Sample Expiration      01/30/2022,2359 Performed at [facility], [address] [place], [place] [address]   OB Urine Culture   Collection Time: 01/29/22 10:25 AM   Specimen: Urine, Random  Result Value Ref Range   Specimen Description URINE, RANDOM    Special Requests NONE    Culture (A)     MULTIPLE SPECIES PRESENT, SUGGEST RECOLLECTION NO GROUP B STREP (S.AGALACTIAE) ISOLATED Performed at [facility], [address], [place], [place] [address]    Report Status 01/30/2022 FINAL   CBC   Collection Time: 01/29/22 10:35 AM  Result Value Ref Range   WBC 15.8 (H) 4.0 - 10.5 K/uL   RBC 3.13 (L) 3.87 - 5.11  MIL/uL   Hemoglobin 10.0 (L) 12.0 - 15.0 g/dL   HCT 14.7 (L) 53.6 - 14.4 %   MCV 97.1 80.0 - 100.0 fL   MCH 31.9 26.0 - 34.0 pg   MCHC 32.9 30.0 - 36.0 g/dL   RDW 18.0 60.7 - 14.9 %   Platelets 379 150 - 400 K/uL   nRBC 0.0 0.0 - 0.2 %  Comprehensive metabolic panel   Collection Time: 01/29/22 10:35 AM  Result Value Ref Range  Sodium 137 135 - 145 mmol/L   Potassium 3.8 3.5 - 5.1 mmol/L   Chloride 105 98 - 111 mmol/L   CO2 25 22 - 32 mmol/L   Glucose, Bld 115 (H) 70 - 99 mg/dL   BUN 5 (L) 6 - 20 mg/dL   Creatinine, Ser 0.59 0.44 - 1.00 mg/dL   Calcium 8.8 (L) 8.9 - 10.3 mg/dL   Total Protein 5.1 (L) 6.5 - 8.1 g/dL   Albumin 2.1 (L) 3.5 - 5.0 g/dL   AST 10 (L) 15 - 41 U/L   ALT 13 0 - 44 U/L   Alkaline Phosphatase 74 38 - 126 U/L   Total Bilirubin 0.2 (L) 0.3 - 1.2 mg/dL   GFR, Estimated >60 >60 mL/min   Anion gap 7 5 - 15   CT ABDOMEN PELVIS WO CONTRAST  Result Date: 01/31/2022 CLINICAL DATA:  "Nephrolithiasis". Twenty-six weeks pregnant. Left flank pain. EXAM: CT ABDOMEN AND PELVIS WITHOUT CONTRAST TECHNIQUE: Multidetector CT imaging of the abdomen and pelvis was performed following the standard protocol without IV contrast. RADIATION DOSE REDUCTION: This exam was performed according to the departmental dose-optimization program which includes automated exposure control, adjustment of the mA and/or kV according to patient size and/or use of iterative reconstruction technique. COMPARISON:  Renal ultrasound of 01/27/2022. Most recent abdominopelvic CT of 03/01/2011. FINDINGS: Lower chest: Clear lung bases. Normal heart size without pericardial or pleural effusion. Hepatobiliary: Normal liver. Normal gallbladder, without biliary ductal dilatation. Pancreas: Normal, without mass or ductal dilatation. Spleen: Normal in size, without focal abnormality. Adrenals/Urinary Tract: Normal adrenal glands. Multiple left renal collecting system calculi, including at up to 4 mm in the upper pole. No  hydronephrosis. Ureters are difficult to follow, but no hydroureter or ureteric calculi are seen. Stomach/Bowel: Normal stomach, without wall thickening. Colonic stool burden suggests constipation. Normal small bowel. Normal terminal ileum and appendix, both positioned anteriorly. Example appendix on 55/3. Vascular/Lymphatic: Aortic atherosclerosis. No abdominopelvic adenopathy. Reproductive: Intrauterine pregnancy. Cephalic position with anterior placenta. No adnexal mass. Other: No significant free fluid.  No free intraperitoneal air. Musculoskeletal: Degenerate disc disease at the lumbosacral junction. IMPRESSION: 1. Left nephrolithiasis, without obstructive uropathy. 2.  Possible constipation. 3. Normal appendix. 4.  Aortic Atherosclerosis (ICD10-I70.0). Electronically Signed   By: Abigail Miyamoto M.D.   On: 01/31/2022 15:55   US Renal  Result Date: 01/27/2022 CLINICAL DATA:  Left flank pain.  Twenty-six weeks pregnant. EXAM: RENAL / URINARY TRACT ULTRASOUND COMPLETE COMPARISON:  CT abdomen and pelvis 03/21/2011. FINDINGS: Right Kidney: Renal measurements: 11.9 x 5.6 x 5.6 cm = volume: 197 mL. Echogenicity within normal limits. No mass or hydronephrosis visualized. Left Kidney: Renal measurements: 12.5 x 5.3 x 6.5 cm = volume: 127 mL. Echogenicity within normal limits. Echogenic 10 mm shadowing likely stone is seen within the mid to inferior left kidney. No mass or hydronephrosis visualized. Bladder: Appears normal for degree of bladder distention. Other: None. IMPRESSION: Non-obstructing left renal stone measuring up to 10 mm. No hydronephrosis within either kidney. Electronically Signed   By: Yvonne Kendall M.D.   On: 01/27/2022 13:59    Future Appointments  Date Time Provider [place]  02/11/2022  3:30 PM Constant, Peggy, MD [place] None  02/14/2022  2:40 PM Tobb, Kardie, DO [facility] None  03/10/2022  7:45 AM [facility] NURSE [facility]  03/10/2022  8:00 AM [facility] US1 [facility]    Discharge  Condition: Stable  Discharge disposition: 01-Home or Self Care       Discharge  Instructions     Discharge activity:  No Restrictions   Complete by: As directed    Discharge diet:  No restrictions   Complete by: As directed    Fetal Kick Count:  Lie on our left side for one hour after a meal, and count the number of times your baby kicks.  If it is less than 5 times, get up, move around and drink some juice.  Repeat the test 30 minutes later.  If it is still less than 5 kicks in an hour, notify your doctor.   Complete by: As directed    No sexual activity restrictions   Complete by: As directed       Allergies as of 02/01/2022       Reactions   Zoloft [sertraline Hcl] Other (See Comments)   Seizure        Medication List     TAKE these medications    acetaminophen 325 MG tablet Commonly known as: TYLENOL Take 2 tablets (650 mg total) by mouth every 4 (four) hours as needed (for pain scale < 4  OR  temperature  >/=  100.5 F).   aspirin EC 81 MG tablet Take 1 tablet (81 mg total) by mouth daily. Swallow whole.   Blood Pressure Kit Devi 1 kit by Does not apply route once a week.   cephALEXin 500 MG capsule Commonly known as: Keflex Take 1 capsule (500 mg total) by mouth 2 (two) times daily for 10 days.   docusate sodium 100 MG capsule Commonly known as: COLACE Take 1 capsule (100 mg total) by mouth daily.   Gojji Weight Scale Misc 1 Device by Does not apply route every 30 (thirty) days.   nicotine 21 mg/24hr patch Commonly known as: NICODERM CQ - dosed in mg/24 hours Place 1 patch (21 mg total) onto the skin daily.   ondansetron 4 MG disintegrating tablet Commonly known as: ZOFRAN-ODT Take 1 tablet (4 mg total) by mouth every 8 (eight) hours as needed for nausea or vomiting.   oxyCODONE 5 MG immediate release tablet Commonly known as: Oxy IR/ROXICODONE Take 1 tablet (5 mg total) by mouth every 6 (six) hours as needed for up to 5 days for severe pain or  breakthrough pain.   Prenatal Plus Vitamin/Mineral 27-1 MG Tabs Take 1 tablet by mouth daily.   tamsulosin 0.4 MG Caps capsule Commonly known as: FLOMAX Take 1 capsule (0.4 mg total) by mouth daily.        Follow-up Information     [facility]. Schedule an appointment as soon as possible for a visit in 1 week(s).   Specialty: Obstetrics and Gynecology Why: OB follow up Contact information: [address] [place] [phone]                Total discharge time: 30 minutes   Signed: Donnamae Jude M.D. 02/01/2022, 11:37 AM

## 2022-02-01 NOTE — Progress Notes (Signed)
FACULTY PRACTICE ANTEPARTUM(COMPREHENSIVE) NOTE  Cassandra Roth is a 41 y.o. [phone] with Estimated Date of Delivery: 05/05/22   By  best clinical estimate [redacted]w[redacted]d  who is admitted for nephrolithiasis.    Fetal presentation is unsure. Length of Stay:  0  Days  Date of admission:01/27/2022  Subjective: Pt resting comfortably this am, notes pain mostly a 4/10.  Slept through the night without much difficulty.  Still having intermittent pain on her left.  No fever or chills. Patient reports the fetal movement as active. Patient reports uterine contraction  activity as none. Patient reports  vaginal bleeding as none. Patient describes fluid per vagina as None.  Vitals:  Blood pressure (!) 107/58, pulse 88, temperature 97.6 F (36.4 C), temperature source Oral, resp. rate 14, height 5\' 3"  (1.6 m), weight 94 kg, last menstrual period 07/29/2021, SpO2 96 %, unknown if currently breastfeeding. Vitals:   02/01/22 0351 02/01/22 0536 02/01/22 0601 02/01/22 0625  BP: (!) 107/58     Pulse: 88     Resp: 18 12 14    Temp: 97.6 F (36.4 C)     TempSrc: Oral     SpO2: 96% 98% 99% 96%  Weight:      Height:       Physical Examination:  General appearance - sleepy Mental status - normal mood, behavior, speech, dress, motor activity, and thought processes Chest - clear to auscultation, no wheezes, rales or rhonchi, symmetric air entry Heart - normal rate and regular rhythm Abdomen - gravid, soft and non-tender Back exam - Left sided CVA/back pain Extremities - 2+ edema, no calf tenderness bilaterally Skin - warm and dry  Fetal Monitoring:  Baseline: 140 bpm, Variability: moderate, Accelerations: absent, and Decelerations: Absent    NST reassuring for current gestational age  Labs:     Component 3 d ago  Specimen Description URINE, RANDOM   Special Requests NONE   Culture  Abnormal  MULTIPLE SPECIES PRESENT, SUGGEST RECOLLECTION  NO GROUP B STREP (S.AGALACTIAE) ISOLATED  Performed at [facility], [address] [place], [address] [place]       Imaging Studies:    CT ABDOMEN PELVIS WO CONTRAST  Result Date: 01/31/2022 CLINICAL DATA:  "Nephrolithiasis". Twenty-six weeks pregnant. Left flank pain. EXAM: CT ABDOMEN AND PELVIS WITHOUT CONTRAST TECHNIQUE: Multidetector CT imaging of the abdomen and pelvis was performed following the standard protocol without IV contrast. RADIATION DOSE REDUCTION: This exam was performed according to the departmental dose-optimization program which includes automated exposure control, adjustment of the mA and/or kV according to patient size and/or use of iterative reconstruction technique. COMPARISON:  Renal ultrasound of 01/27/2022. Most recent abdominopelvic CT of 03/01/2011. FINDINGS: Lower chest: Clear lung bases. Normal heart size without pericardial or pleural effusion. Hepatobiliary: Normal liver. Normal gallbladder, without biliary ductal dilatation. Pancreas: Normal, without mass or ductal dilatation. Spleen: Normal in size, without focal abnormality. Adrenals/Urinary Tract: Normal adrenal glands. Multiple left renal collecting system calculi, including at up to 4 mm in the upper pole. No hydronephrosis. Ureters are difficult to follow, but no hydroureter or ureteric calculi are seen. Stomach/Bowel: Normal stomach, without wall thickening. Colonic stool burden suggests constipation. Normal small bowel. Normal terminal ileum and appendix, both positioned anteriorly. Example appendix on 55/3. Vascular/Lymphatic: Aortic atherosclerosis. No abdominopelvic adenopathy. Reproductive: Intrauterine pregnancy. Cephalic position with anterior placenta. No adnexal mass. Other: No significant free fluid.  No free intraperitoneal air. Musculoskeletal: Degenerate disc disease at the lumbosacral junction. IMPRESSION: 1. Left nephrolithiasis, without obstructive  uropathy. 2.  Possible constipation. 3. Normal appendix. 4.  Aortic Atherosclerosis (ICD10-I70.0).  Electronically Signed   By: Jeronimo Greaves M.D.   On: 01/31/2022 15:55     Medications:  Scheduled  docusate sodium  100 mg Oral Daily   nicotine  21 mg Transdermal Daily   prenatal multivitamin  1 tablet Oral Q1200   tamsulosin  0.4 mg Oral Daily   I have reviewed the patient's current medications.  ASSESSMENT: G3P2002 [redacted]w[redacted]d Estimated Date of Delivery: 05/05/22  Patient Active Problem List   Diagnosis Date Noted   Leukocytosis 01/16/2022   Advanced maternal age in multigravida 11/19/2021   Supervision of other normal pregnancy, antepartum 11/18/2021   Nephrolithiasis 11/29/2018   Cigarette nicotine dependence without complication 09/17/2018    PLAN: 1) Nephrolithiasis -appreciate urology recommendations, no further interventions at this time -plan to transition to oral pain management -continue Flomax  2) Leukocytosis -Urine culture as above, repeat collected -plan to treat with Keflex until repeat culture returns  3) Tobacco dependence -continue patch  4) Fetal well being -NST reassuring for current gestational age   DISPO: If pain manageable with oral treatment will plan for discharge home with close outpatient follow up  Cassandra Roth 02/01/2022,7:14 AM

## 2022-02-01 NOTE — Progress Notes (Signed)
Teaching complete 

## 2022-02-02 LAB — CULTURE, OB URINE: Culture: <10000 — AB

## 2022-02-11 ENCOUNTER — Other Ambulatory Visit: Payer: Self-pay

## 2022-02-11 ENCOUNTER — Encounter: Payer: Self-pay | Admitting: Obstetrics and Gynecology

## 2022-02-11 ENCOUNTER — Ambulatory Visit (INDEPENDENT_AMBULATORY_CARE_PROVIDER_SITE_OTHER): Payer: Commercial Managed Care - HMO | Admitting: Obstetrics and Gynecology

## 2022-02-11 VITALS — BP 128/85 | HR 106 | Wt 209.2 lb

## 2022-02-11 DIAGNOSIS — N200 Calculus of kidney: Secondary | ICD-10-CM

## 2022-02-11 DIAGNOSIS — O09523 Supervision of elderly multigravida, third trimester: Secondary | ICD-10-CM

## 2022-02-11 DIAGNOSIS — Z3480 Encounter for supervision of other normal pregnancy, unspecified trimester: Secondary | ICD-10-CM

## 2022-02-11 NOTE — Progress Notes (Signed)
Patient presents for ROB. Patient has not completed GTT. Patient advised to schedule Lab only visit to complete GTT. TDAP declined. No other concerns

## 2022-02-11 NOTE — Progress Notes (Signed)
° °  PRENATAL VISIT NOTE  Subjective:  Cassandra Roth is a 41 y.o. G3P2002 at [redacted]w[redacted]d being seen today for ongoing prenatal care.  She is currently monitored for the following issues for this high-risk pregnancy and has Supervision of other normal pregnancy, antepartum; Advanced maternal age in multigravida; Leukocytosis; Nephrolithiasis; and Cigarette nicotine dependence without complication on their problem list.  Patient reports no complaints.  Contractions: Not present. Vag. Bleeding: None.  Movement: Present. Denies leaking of fluid.   The following portions of the patient's history were reviewed and updated as appropriate: allergies, current medications, past family history, past medical history, past social history, past surgical history and problem list.   Objective:   Vitals:   02/11/22 1530  BP: 128/85  Pulse: (!) 106  Weight: 209 lb 3.2 oz (94.9 kg)    Fetal Status: Fetal Heart Rate (bpm): 145 Fundal Height: 28 cm Movement: Present     General:  Alert, oriented and cooperative. Patient is in no acute distress.  Skin: Skin is warm and dry. No rash noted.   Cardiovascular: Normal heart rate noted  Respiratory: Normal respiratory effort, no problems with respiration noted  Abdomen: Soft, gravid, appropriate for gestational age.  Pain/Pressure: Absent     Pelvic: Cervical exam deferred        Extremities: Normal range of motion.  Edema: None  Mental Status: Normal mood and affect. Normal behavior. Normal judgment and thought content.   Assessment and Plan:  Pregnancy: G3P2002 at [redacted]w[redacted]d 1. Supervision of other normal pregnancy, antepartum Patient is doing well without complaints Third trimester labs and glucola before next visit Patient undecided on pediatrician Patient is considering BTL- consent signed today as Medicaid is secondary Follow up as scheduled with cardiology for evaluation of near syncopal episodes  2. Multigravida of advanced maternal age in third  trimester Follow up growth ultrasound scheduled  3. Nephrolithiasis Patient completed keflex No pain currently  Preterm labor symptoms and general obstetric precautions including but not limited to vaginal bleeding, contractions, leaking of fluid and fetal movement were reviewed in detail with the patient. Please refer to After Visit Summary for other counseling recommendations.   Return in about 2 weeks (around 02/25/2022) for in person, ROB, High risk.  Future Appointments  Date Time Provider Department Center  02/14/2022  2:40 PM Thomasene Ripple, DO [facility] None  03/10/2022  7:45 AM [facility] NURSE [facility]  03/10/2022  8:00 AM [facility] US1 [facility]    Catalina Antigua, MD

## 2022-02-14 ENCOUNTER — Other Ambulatory Visit: Payer: Commercial Managed Care - HMO

## 2022-02-14 ENCOUNTER — Ambulatory Visit: Payer: Medicaid Other | Admitting: Cardiology

## 2022-02-14 ENCOUNTER — Other Ambulatory Visit: Payer: Self-pay

## 2022-02-14 DIAGNOSIS — Z3480 Encounter for supervision of other normal pregnancy, unspecified trimester: Secondary | ICD-10-CM

## 2022-02-15 LAB — CBC
Hematocrit: 34.9 % (ref 34.0–46.6)
Hemoglobin: 11.9 g/dL (ref 11.1–15.9)
MCH: 32.5 pg (ref 26.6–33.0)
MCHC: 34.1 g/dL (ref 31.5–35.7)
MCV: 95 fL (ref 79–97)
Platelets: 517 10*3/uL — ABNORMAL HIGH (ref 150–450)
RBC: 3.66 x10E6/uL — ABNORMAL LOW (ref 3.77–5.28)
RDW: 13.2 % (ref 11.7–15.4)
WBC: 22.7 10*3/uL (ref 3.4–10.8)

## 2022-02-15 LAB — GLUCOSE TOLERANCE, 2 HOURS W/ 1HR
Glucose, 1 hour: 187 mg/dL — ABNORMAL HIGH (ref 70–179)
Glucose, 2 hour: 124 mg/dL (ref 70–152)
Glucose, Fasting: 88 mg/dL (ref 70–91)

## 2022-02-15 LAB — RPR: RPR Ser Ql: NONREACTIVE

## 2022-02-15 LAB — HIV ANTIBODY (ROUTINE TESTING W REFLEX): HIV Screen 4th Generation wRfx: NONREACTIVE

## 2022-02-17 ENCOUNTER — Telehealth: Payer: Self-pay

## 2022-02-17 ENCOUNTER — Encounter: Payer: Self-pay | Admitting: Obstetrics and Gynecology

## 2022-02-17 ENCOUNTER — Other Ambulatory Visit: Payer: Self-pay | Admitting: Obstetrics and Gynecology

## 2022-02-17 DIAGNOSIS — O24419 Gestational diabetes mellitus in pregnancy, unspecified control: Secondary | ICD-10-CM

## 2022-02-17 DIAGNOSIS — Z8632 Personal history of gestational diabetes: Secondary | ICD-10-CM | POA: Insufficient documentation

## 2022-02-17 MED ORDER — CONTOUR NEXT MONITOR W/DEVICE KIT: 1.0000 | PACK | 0 refills | Status: AC | PRN

## 2022-02-17 MED ORDER — CONTOUR NEXT TEST VI STRP: ORAL_STRIP | 12 refills | Status: DC

## 2022-02-17 MED ORDER — ACCU-CHEK SOFTCLIX LANCETS MISC: 1.0000 | Freq: Four times a day (QID) | 12 refills | Status: AC

## 2022-02-17 NOTE — Progress Notes (Signed)
Please inform patient with abnormal glucola and diagnosis of GDM. She has been referred to a nutritionist

## 2022-02-17 NOTE — Telephone Encounter (Signed)
Attempted to contact about glucose results and referral, no answer, left vm. Supplies sent to pharmacy

## 2022-02-17 NOTE — Telephone Encounter (Signed)
Pt called in, advised of results, glucose supplies and referral to diabetes education.

## 2022-02-18 ENCOUNTER — Other Ambulatory Visit: Payer: Self-pay

## 2022-02-18 DIAGNOSIS — O24419 Gestational diabetes mellitus in pregnancy, unspecified control: Secondary | ICD-10-CM

## 2022-02-18 MED ORDER — ACCU-CHEK SOFTCLIX LANCETS MISC: 100.0000 | Freq: Four times a day (QID) | 12 refills | Status: AC

## 2022-02-18 MED ORDER — ACCU-CHEK GUIDE ME W/DEVICE KIT: 1.0000 | PACK | Freq: Every day | 0 refills | Status: AC

## 2022-02-18 MED ORDER — ACCU-CHEK GUIDE VI STRP: ORAL_STRIP | 12 refills | Status: DC

## 2022-02-18 MED ORDER — ACCU-CHEK GUIDE VI STRP: ORAL_STRIP | 12 refills | Status: AC

## 2022-02-20 ENCOUNTER — Encounter (HOSPITAL_COMMUNITY): Payer: Self-pay | Admitting: Obstetrics & Gynecology

## 2022-02-20 ENCOUNTER — Observation Stay (HOSPITAL_COMMUNITY)
Admission: AD | Admit: 2022-02-20 | Discharge: 2022-02-22 | Disposition: A | Discharge status: Home / Self Care | Payer: No Typology Code available for payment source | Attending: Obstetrics & Gynecology | Admitting: Obstetrics & Gynecology

## 2022-02-20 ENCOUNTER — Other Ambulatory Visit: Payer: Self-pay

## 2022-02-20 DIAGNOSIS — Z20822 Contact with and (suspected) exposure to covid-19: Secondary | ICD-10-CM | POA: Insufficient documentation

## 2022-02-20 DIAGNOSIS — O0990 Supervision of high risk pregnancy, unspecified, unspecified trimester: Secondary | ICD-10-CM

## 2022-02-20 DIAGNOSIS — O99613 Diseases of the digestive system complicating pregnancy, third trimester: Principal | ICD-10-CM | POA: Insufficient documentation

## 2022-02-20 DIAGNOSIS — O09523 Supervision of elderly multigravida, third trimester: Secondary | ICD-10-CM | POA: Insufficient documentation

## 2022-02-20 DIAGNOSIS — Z7982 Long term (current) use of aspirin: Secondary | ICD-10-CM | POA: Insufficient documentation

## 2022-02-20 DIAGNOSIS — Z3A29 29 weeks gestation of pregnancy: Secondary | ICD-10-CM | POA: Insufficient documentation

## 2022-02-20 DIAGNOSIS — K1121 Acute sialoadenitis: Secondary | ICD-10-CM | POA: Diagnosis not present

## 2022-02-20 DIAGNOSIS — Z8632 Personal history of gestational diabetes: Secondary | ICD-10-CM | POA: Diagnosis present

## 2022-02-20 DIAGNOSIS — Z3480 Encounter for supervision of other normal pregnancy, unspecified trimester: Secondary | ICD-10-CM

## 2022-02-20 DIAGNOSIS — O24419 Gestational diabetes mellitus in pregnancy, unspecified control: Secondary | ICD-10-CM | POA: Insufficient documentation

## 2022-02-20 LAB — URINALYSIS, ROUTINE W REFLEX MICROSCOPIC
Bilirubin Urine: NEGATIVE
Glucose, UA: NEGATIVE mg/dL
Ketones, ur: NEGATIVE mg/dL
Leukocytes,Ua: NEGATIVE
Nitrite: NEGATIVE
Protein, ur: 30 mg/dL — AB
Specific Gravity, Urine: 1.021 (ref 1.005–1.030)
pH: 6 (ref 5.0–8.0)

## 2022-02-20 NOTE — Progress Notes (Deleted)
?[facility] ? ?New Evaluation ? ?Date:  02/21/2022  ? ?ID:  Cassandra Roth, DOB May 20, 1981, MRN [id number] ? ?PCP:  Darryl Lent, PA-C ?  ?[facility] Providers ?Cardiologist:  None  ?Electrophysiologist:  None      ? ?Referring MD: Adam Phenix, MD  ? ?Chief Complaint: *** ? ?History of Present Illness:   ? ?Cassandra Roth is a 41 y.o. female [[id number]] who is being seen today for the evaluation of *** at the request of Adam Phenix, MD.  ? ? ?Prior CV Studies Reviewed: ?The following studies were reviewed today: ?*** ? ?Past Medical History:  ?Diagnosis Date  ? Anxiety   ? Back pain   ? Migraine   ? Ovarian cyst   ? Seizures (HCC)   ? after taking zoloft  ? ? ?Past Surgical History:  ?Procedure Laterality Date  ? OVARIAN CYST REMOVAL    ? { ?Click here to update PMH, PSH, OB Hx then refresh note  :1}  ? ?OB History   ? ? Gravida  ?3  ? Para  ?2  ? Term  ?2  ? Preterm  ?   ? AB  ?   ? Living  ?2  ?  ? ? SAB  ?   ? IAB  ?   ? Ectopic  ?   ? Multiple  ?   ? Live Births  ?2  ?   ?  ?  ?  { ?Click here to update OB Charting then refresh note  :1}  ? ? ?Current Medications: ?No outpatient medications have been marked as taking for the 02/21/22 encounter (Appointment) with Meriam Sprague, MD.  ?  ? ?Allergies:   Zoloft [sertraline hcl]  ? ?Social History  ? ?Socioeconomic History  ? Marital status: Married  ?  Spouse name: Not on file  ? Number of children: Not on file  ? Years of education: Not on file  ? Highest education level: Not on file  ?Occupational History  ? Not on file  ?Tobacco Use  ? Smoking status: Every Day  ?  Packs/day: 0.50  ?  Types: Cigarettes  ? Smokeless tobacco: Never  ?Vaping Use  ? Vaping Use: Never used  ?Substance and Sexual Activity  ? Alcohol use: Not Currently  ?  Comment: not since confirmed pregnancy  ? Drug use: Yes  ?  Types: Other-see comments  ?  Comment: stopped methadone 3 weeks ago  ? Sexual activity: Yes  ?  Partners: Male  ?  Birth control/protection: None   ?Other Topics Concern  ? Not on file  ?Social History Narrative  ? Not on file  ? ?Social Determinants of Health  ? ?Financial Resource Strain: Not on file  ?Food Insecurity: Not on file  ?Transportation Needs: Not on file  ?Physical Activity: Not on file  ?Stress: Not on file  ?Social Connections: Not on file  ?{ ?Click here to update SDOH then refresh :1}  ? ? ?Family History  ?Problem Relation Age of Onset  ? Hypertension Mother   ? Diabetes Mother   ? Kidney cancer Mother   ? Throat cancer Maternal Grandmother   ? Dementia Maternal Grandmother   ? { ?Click here to update FH then refresh note    :1}  ? ?ROS:   ?Please see the history of present illness.    ?*** ?All other systems reviewed and are negative. ? ? ?Labs/EKG Reviewed:   ? ?EKG:   ?EKG  is *** ordered today.  The ekg ordered today demonstrates *** ? ?Recent Labs: ?01/29/2022: ALT 13; BUN 5; Creatinine, Ser 0.59; Potassium 3.8; Sodium 137 ?02/14/2022: Hemoglobin 11.9; Platelets 517  ? ?Recent Lipid Panel ?No results found for: CHOL, TRIG, HDL, CHOLHDL, LDLCALC, LDLDIRECT ? ?Physical Exam:   ? ?VS:  LMP 07/29/2021    ? ?Wt Readings from Last 3 Encounters:  ?02/11/22 209 lb 3.2 oz (94.9 kg)  ?01/27/22 207 lb 3.2 oz (94 kg)  ?01/14/22 202 lb (91.6 kg)  ?  ? ?GEN: *** Well nourished, well developed in no acute distress ?HEENT: Normal ?NECK: No JVD; No carotid bruits ?LYMPHATICS: No lymphadenopathy ?CARDIAC: ***RRR, no murmurs, rubs, gallops ?RESPIRATORY:  Clear to auscultation without rales, wheezing or rhonchi  ?ABDOMEN: Soft, non-tender, non-distended ?MUSCULOSKELETAL:  No edema; No deformity  ?SKIN: Warm and dry ?NEUROLOGIC:  Alert and oriented x 3 ?PSYCHIATRIC:  Normal affect  ? ? ?Risk Assessment/Risk Calculators:   ?{ ?Click to calculate CARPREG II - THEN refresh note :1}  ?  ?{ ?Click to caclulate Mod WHO Class of CV Risk - THEN refresh note :1}  ?   ?{ ?Click for CHADS2VASc Score - THEN Refresh Note    :[id number]}  ?  ? ? ?ASSESSMENT & PLAN:    ? ?*** ?There are no Patient Instructions on file for this visit. ? ? ?Dispo:  No follow-ups on file.  ? ?Medication Adjustments/Labs and Tests Ordered: ?Current medicines are reviewed at length with the patient today.  Concerns regarding medicines are outlined above.  ?Tests Ordered: ?No orders of the defined types were placed in this encounter. ? ?Medication Changes: ?No orders of the defined types were placed in this encounter. ?  ?

## 2022-02-20 NOTE — MAU Provider Note (Signed)
History     [id number]  Arrival date and time: 02/20/22 2145    Chief Complaint  Patient presents with   Decreased Fetal Movement   Jaw Pain   Abdominal Pain     HPI AZHIA SIEFKEN is a 41 y.o. at [redacted]w[redacted]d by LMP with PMHx notable for migraines, who presents for jaw pain, decreased fetal movement, & abdominal pain. Reports right jaw pain and swelling that started this evening.  She was sitting at a computer during work and noticed the pain.  Pain is improved without intervention but continues to be swollen.  Does report history of dental issues but currently denies any dental pain.  Denies ear pain, fever, sore throat, or trauma. Noticed decreased fetal movement earlier today which resolved upon arrival to MAU.  Also has had some intermittent abdominal pain that she cannot tell if it is contractions.  Not occurring frequently.  Denies urinary complaints, leaking of fluid, or vaginal bleeding.  OB History     Gravida  3   Para  2   Term  2   Preterm      AB      Living  2      SAB      IAB      Ectopic      Multiple      Live Births  2           Past Medical History:  Diagnosis Date   Anxiety    Back pain    Migraine    Ovarian cyst    Seizures ([place])    after taking zoloft    Past Surgical History:  Procedure Laterality Date   OVARIAN CYST REMOVAL      Family History  Problem Relation Age of Onset   Hypertension Mother    Diabetes Mother    Kidney cancer Mother    Throat cancer Maternal Grandmother    Dementia Maternal Grandmother     Allergies  Allergen Reactions   Zoloft [Sertraline Hcl] Other (See Comments)    Seizure    No current facility-administered medications on file prior to encounter.   Current Outpatient Medications on File Prior to Encounter  Medication Sig Dispense Refill   Accu-Chek Softclix Lancets lancets 1 each by Other route 4 (four) times daily. Use as instructed 100 each 12   Accu-Chek Softclix Lancets lancets 100  each by Other route 4 (four) times daily. 100 each 12   acetaminophen (TYLENOL) 325 MG tablet Take 2 tablets (650 mg total) by mouth every 4 (four) hours as needed (for pain scale < 4  OR  temperature  >/=  100.5 F).     aspirin EC 81 MG tablet Take 1 tablet (81 mg total) by mouth daily. Swallow whole. (Patient not taking: Reported on 01/29/2022) 60 tablet 4   Blood Glucose Monitoring Suppl (ACCU-CHEK GUIDE ME) w/Device KIT 1 kit by Does not apply route daily. 1 kit 0   Blood Glucose Monitoring Suppl (CONTOUR NEXT MONITOR) w/Device KIT 1 kit by Does not apply route as needed. 1 kit 0   Blood Pressure Monitoring (BLOOD PRESSURE KIT) DEVI 1 kit by Does not apply route once a week. (Patient not taking: Reported on 02/11/2022) 1 each 0   docusate sodium (COLACE) 100 MG capsule Take 1 capsule (100 mg total) by mouth daily. (Patient not taking: Reported on 02/11/2022) 10 capsule 0   glucose blood (ACCU-CHEK GUIDE) test strip Use 4 times daily. Please check blood  sugar first thing in the morning before breakfast fasting and 2 hours after every meal. 100 each 12   Misc. Devices (GOJJI WEIGHT SCALE) MISC 1 Device by Does not apply route every 30 (thirty) days. (Patient not taking: Reported on 02/11/2022) 1 each 0   nicotine (NICODERM CQ - DOSED IN MG/24 HOURS) 21 mg/24hr patch Place 1 patch (21 mg total) onto the skin daily. (Patient not taking: Reported on 02/11/2022) 28 patch 0   ondansetron (ZOFRAN-ODT) 4 MG disintegrating tablet Take 1 tablet (4 mg total) by mouth every 8 (eight) hours as needed for nausea or vomiting. (Patient not taking: Reported on 02/11/2022) 20 tablet 0   Prenatal Vit-Fe Fumarate-FA (PRENATAL PLUS VITAMIN/MINERAL) 27-1 MG TABS Take 1 tablet by mouth daily.       ROS Pertinent positives and negative per HPI, all others reviewed and negative  Physical Exam   BP 127/69 (BP Location: Left Arm)    Pulse (!) 106    Temp 98.2 F (36.8 C) (Oral)    Resp 16    Ht 5\' 3"  (1.6 m)    LMP 07/29/2021     SpO2 99%    BMI 37.06 kg/m   Patient Vitals for the past 24 hrs:  BP Temp Temp src Pulse Resp SpO2 Height  02/21/22 0256 127/69 98.2 F (36.8 C) Oral (!) 106 16 99 % --  02/20/22 2223 118/81 98.5 F (36.9 C) -- (!) 109 (!) 22 99 % 5\' 3"  (1.6 m)    Physical Exam Vitals and nursing note reviewed. Exam conducted with a chaperone present.  Constitutional:      Appearance: She is well-developed. She is not ill-appearing.  HENT:     Head:     Jaw: Tenderness and swelling present.      Right Ear: Tympanic membrane, ear canal and external ear normal. No mastoid tenderness.     Left Ear: Tympanic membrane, ear canal and external ear normal. No mastoid tenderness.     Mouth/Throat:     Dentition: Dental caries present. No gingival swelling or dental abscesses.  Pulmonary:     Effort: Pulmonary effort is normal. No respiratory distress.  Lymphadenopathy:     Cervical: No cervical adenopathy.  Skin:    General: Skin is warm and dry.  Neurological:     Mental Status: She is alert.  Psychiatric:        Mood and Affect: Mood normal.        Behavior: Behavior normal.     Cervical Exam Dilation: Closed Effacement (%): Thick Cervical Position: Posterior Station: -3 Exam by:: Jorje Guild NP  FHT Baseline 145, moderate variability, 15x15 accels, no decels Toco: none Cat: 1  Labs Results for orders placed or performed during the hospital encounter of 02/20/22 (from the past 24 hour(s))  Urinalysis, Routine w reflex microscopic     Status: Abnormal   Collection Time: 02/20/22 10:30 PM  Result Value Ref Range   Color, Urine YELLOW YELLOW   APPearance HAZY (A) CLEAR   Specific Gravity, Urine 1.021 1.005 - 1.030   pH 6.0 5.0 - 8.0   Glucose, UA NEGATIVE NEGATIVE mg/dL   Hgb urine dipstick SMALL (A) NEGATIVE   Bilirubin Urine NEGATIVE NEGATIVE   Ketones, ur NEGATIVE NEGATIVE mg/dL   Protein, ur 30 (A) NEGATIVE mg/dL   Nitrite NEGATIVE NEGATIVE   Leukocytes,Ua NEGATIVE  NEGATIVE   RBC / HPF 21-50 0 - 5 RBC/hpf   WBC, UA 6-10 0 - 5 WBC/hpf  Bacteria, UA FEW (A) NONE SEEN   Squamous Epithelial / LPF 0-5 0 - 5   Mucus PRESENT    Ca Oxalate Crys, UA PRESENT   Comprehensive metabolic panel     Status: Abnormal   Collection Time: 02/21/22 12:08 AM  Result Value Ref Range   Sodium 136 135 - 145 mmol/L   Potassium 3.4 (L) 3.5 - 5.1 mmol/L   Chloride 106 98 - 111 mmol/L   CO2 21 (L) 22 - 32 mmol/L   Glucose, Bld 100 (H) 70 - 99 mg/dL   BUN 19 6 - 20 mg/dL   Creatinine, Ser 9.05 0.44 - 1.00 mg/dL   Calcium 9.3 8.9 - 64.6 mg/dL   Total Protein 6.1 (L) 6.5 - 8.1 g/dL   Albumin 2.6 (L) 3.5 - 5.0 g/dL   AST 10 (L) 15 - 41 U/L   ALT 14 0 - 44 U/L   Alkaline Phosphatase 107 38 - 126 U/L   Total Bilirubin 0.2 (L) 0.3 - 1.2 mg/dL   GFR, Estimated >98 >06 mL/min   Anion gap 9 5 - 15  CBC with Differential/Platelet     Status: Abnormal   Collection Time: 02/21/22  3:16 AM  Result Value Ref Range   WBC 22.7 (H) 4.0 - 10.5 K/uL   RBC 3.33 (L) 3.87 - 5.11 MIL/uL   Hemoglobin 10.9 (L) 12.0 - 15.0 g/dL   HCT 07.8 (L) 95.0 - 11.5 %   MCV 97.9 80.0 - 100.0 fL   MCH 32.7 26.0 - 34.0 pg   MCHC 33.4 30.0 - 36.0 g/dL   RDW 67.1 64.0 - 89.0 %   Platelets 430 (H) 150 - 400 K/uL   nRBC 0.0 0.0 - 0.2 %   Neutrophils Relative % 74 %   Neutro Abs 16.9 (H) 1.7 - 7.7 K/uL   Lymphocytes Relative 16 %   Lymphs Abs 3.6 0.7 - 4.0 K/uL   Monocytes Relative 6 %   Monocytes Absolute 1.4 (H) 0.1 - 1.0 K/uL   Eosinophils Relative 2 %   Eosinophils Absolute 0.4 0.0 - 0.5 K/uL   Basophils Relative 0 %   Basophils Absolute 0.1 0.0 - 0.1 K/uL   Immature Granulocytes 2 %   Abs Immature Granulocytes 0.40 (H) 0.00 - 0.07 K/uL    Imaging CT MAXILLOFACIAL W CONTRAST  Result Date: 02/21/2022 CLINICAL DATA:  Right facial pain EXAM: CT MAXILLOFACIAL WITH CONTRAST TECHNIQUE: Multidetector CT imaging of the maxillofacial structures was performed with intravenous contrast. Multiplanar CT  image reconstructions were also generated. RADIATION DOSE REDUCTION: This exam was performed according to the departmental dose-optimization program which includes automated exposure control, adjustment of the mA and/or kV according to patient size and/or use of iterative reconstruction technique. CONTRAST:  63mL OMNIPAQUE IOHEXOL 300 MG/ML  SOLN COMPARISON:  None. FINDINGS: Osseous: Poor dentition.  No acute osseous lesion. Orbits: Negative. No traumatic or inflammatory finding. Sinuses: Clear. Soft tissues: There is mild fat stranding around the right parotid gland. No fluid collection. No sialolithiasis. Limited intracranial: No significant or unexpected finding. IMPRESSION: 1. Mild fat stranding around the right parotid gland, compatible with acute parotitis. No sialolithiasis or abscess. 2. Poor dentition. Electronically Signed   By: Deatra Robinson M.D.   On: 02/21/2022 02:58    MAU Course  Procedures Lab Orders         Resp Panel by RT-PCR (Flu A&B, Covid) Nasopharyngeal Swab         Urinalysis, Routine w reflex microscopic  Comprehensive metabolic panel         CBC with Differential/Platelet    Meds ordered this encounter  Medications   iohexol (OMNIPAQUE) 300 MG/ML solution 50 mL   Imaging Orders         CT MAXILLOFACIAL W CONTRAST      MDM Decreased fetal movement: patient reports return of movement while in MAU & has reactive fetal tracing.  Abdominal pain: no contractions on monitor. Cervix closed/thick.  Right pre auricular swelling & point tenderness. Evaluated by Dr. Cy Blamer. CT ordered to evaluate for abscess & results showed acute parotitis. Based on presentation, exam, and imaging, she meets criteria for suppurative parotitis & will be admitted for IV antibiotics. Discussed with Dr. Harolyn Rutherford - will get ID consult in the morning.  Assessment and Plan   1. Acute suppurative parotitis   2. [redacted] weeks gestation of pregnancy    -admit to [facility] unit -Antibiotics: unasyn 3 gm Q 6  hours & vancomycin per pharmacy consult (increased MRSA risk d/t recent hospitalization) -daily NSTs   Jorje Guild, NP 02/21/22 4:23 AM

## 2022-02-20 NOTE — MAU Note (Addendum)
About 1600 I started having pain in my R jaw. About 2 hours ago a knot came up on my R jaw but the pain is gone. Have had decreased FM this afternoon and having abd pain. Pain feels like contractions. Denies VB or LOF(Unable to sign waiver due to computer being broken) ?

## 2022-02-21 ENCOUNTER — Telehealth: Payer: Self-pay

## 2022-02-21 ENCOUNTER — Inpatient Hospital Stay (HOSPITAL_COMMUNITY): Payer: No Typology Code available for payment source

## 2022-02-21 ENCOUNTER — Ambulatory Visit: Payer: Medicaid Other | Admitting: Cardiology

## 2022-02-21 DIAGNOSIS — Z3A29 29 weeks gestation of pregnancy: Secondary | ICD-10-CM

## 2022-02-21 DIAGNOSIS — O24419 Gestational diabetes mellitus in pregnancy, unspecified control: Secondary | ICD-10-CM | POA: Diagnosis not present

## 2022-02-21 DIAGNOSIS — K1121 Acute sialoadenitis: Secondary | ICD-10-CM | POA: Diagnosis not present

## 2022-02-21 DIAGNOSIS — Z7982 Long term (current) use of aspirin: Secondary | ICD-10-CM | POA: Diagnosis not present

## 2022-02-21 DIAGNOSIS — O0993 Supervision of high risk pregnancy, unspecified, third trimester: Secondary | ICD-10-CM | POA: Diagnosis present

## 2022-02-21 DIAGNOSIS — O09523 Supervision of elderly multigravida, third trimester: Secondary | ICD-10-CM | POA: Diagnosis not present

## 2022-02-21 DIAGNOSIS — Z20822 Contact with and (suspected) exposure to covid-19: Secondary | ICD-10-CM | POA: Diagnosis not present

## 2022-02-21 DIAGNOSIS — O99613 Diseases of the digestive system complicating pregnancy, third trimester: Secondary | ICD-10-CM | POA: Diagnosis not present

## 2022-02-21 LAB — CBC WITH DIFFERENTIAL/PLATELET
Abs Immature Granulocytes: 0.4 10*3/uL — ABNORMAL HIGH (ref 0.00–0.07)
Basophils Absolute: 0.1 10*3/uL (ref 0.0–0.1)
Basophils Relative: 0 %
Eosinophils Absolute: 0.4 10*3/uL (ref 0.0–0.5)
Eosinophils Relative: 2 %
HCT: 32.6 % — ABNORMAL LOW (ref 36.0–46.0)
Hemoglobin: 10.9 g/dL — ABNORMAL LOW (ref 12.0–15.0)
Immature Granulocytes: 2 %
Lymphocytes Relative: 16 %
Lymphs Abs: 3.6 10*3/uL (ref 0.7–4.0)
MCH: 32.7 pg (ref 26.0–34.0)
MCHC: 33.4 g/dL (ref 30.0–36.0)
MCV: 97.9 fL (ref 80.0–100.0)
Monocytes Absolute: 1.4 10*3/uL — ABNORMAL HIGH (ref 0.1–1.0)
Monocytes Relative: 6 %
Neutro Abs: 16.9 10*3/uL — ABNORMAL HIGH (ref 1.7–7.7)
Neutrophils Relative %: 74 %
Platelets: 430 10*3/uL — ABNORMAL HIGH (ref 150–400)
RBC: 3.33 MIL/uL — ABNORMAL LOW (ref 3.87–5.11)
RDW: 14.4 % (ref 11.5–15.5)
WBC: 22.7 10*3/uL — ABNORMAL HIGH (ref 4.0–10.5)
nRBC: 0 % (ref 0.0–0.2)

## 2022-02-21 LAB — COMPREHENSIVE METABOLIC PANEL
ALT: 14 U/L (ref 0–44)
AST: 10 U/L — ABNORMAL LOW (ref 15–41)
Albumin: 2.6 g/dL — ABNORMAL LOW (ref 3.5–5.0)
Alkaline Phosphatase: 107 U/L (ref 38–126)
Anion gap: 9 (ref 5–15)
BUN: 19 mg/dL (ref 6–20)
CO2: 21 mmol/L — ABNORMAL LOW (ref 22–32)
Calcium: 9.3 mg/dL (ref 8.9–10.3)
Chloride: 106 mmol/L (ref 98–111)
Creatinine, Ser: 0.52 mg/dL (ref 0.44–1.00)
GFR, Estimated: >60 mL/min (ref >60)
Glucose, Bld: 100 mg/dL — ABNORMAL HIGH (ref 70–99)
Potassium: 3.4 mmol/L — ABNORMAL LOW (ref 3.5–5.1)
Sodium: 136 mmol/L (ref 135–145)
Total Bilirubin: 0.2 mg/dL — ABNORMAL LOW (ref 0.3–1.2)
Total Protein: 6.1 g/dL — ABNORMAL LOW (ref 6.5–8.1)

## 2022-02-21 LAB — RESP PANEL BY RT-PCR (FLU A&B, COVID) ARPGX2
Influenza A by PCR: NEGATIVE
Influenza B by PCR: NEGATIVE
SARS Coronavirus 2 by RT PCR: NEGATIVE

## 2022-02-21 LAB — GLUCOSE, CAPILLARY
Glucose-Capillary: 137 mg/dL — ABNORMAL HIGH (ref 70–99)
Glucose-Capillary: 91 mg/dL (ref 70–99)
Glucose-Capillary: 114 mg/dL — ABNORMAL HIGH (ref 70–99)
Glucose-Capillary: 105 mg/dL — ABNORMAL HIGH (ref 70–99)

## 2022-02-21 LAB — TYPE AND SCREEN
ABO/RH(D): A NEG
Antibody Screen: NEGATIVE

## 2022-02-21 MED ORDER — ACETAMINOPHEN 325 MG PO TABS
650.0000 mg | ORAL_TABLET | ORAL | Status: DC | PRN
  Administered 2022-02-21: 650 mg via ORAL
  Filled 2022-02-21: qty 2

## 2022-02-21 MED ORDER — ZOLPIDEM TARTRATE 5 MG PO TABS: 5.0000 mg | ORAL_TABLET | Freq: Every evening | ORAL | Status: DC | PRN

## 2022-02-21 MED ORDER — VANCOMYCIN HCL 1.25 G IV SOLR
1250.0000 mg | Freq: Once | INTRAVENOUS | Status: AC
  Administered 2022-02-21: 1250 mg via INTRAVENOUS
  Filled 2022-02-21: qty 25

## 2022-02-21 MED ORDER — OXYCODONE HCL 5 MG PO TABS
5.0000 mg | ORAL_TABLET | ORAL | Status: DC | PRN
  Administered 2022-02-21 – 2022-02-22 (×7): 10 mg via ORAL
  Filled 2022-02-21 (×7): qty 2

## 2022-02-21 MED ORDER — LACTATED RINGERS IV SOLN
INTRAVENOUS | Status: DC
Start: 2022-02-21 — End: 2022-02-22

## 2022-02-21 MED ORDER — SODIUM CHLORIDE 0.9 % IV SOLN
3.0000 g | Freq: Four times a day (QID) | INTRAVENOUS | Status: DC
  Administered 2022-02-21 – 2022-02-22 (×6): 3 g via INTRAVENOUS
  Filled 2022-02-21 (×7): qty 8

## 2022-02-21 MED ORDER — CALCIUM CARBONATE ANTACID 500 MG PO CHEW: 2.0000 | CHEWABLE_TABLET | ORAL | Status: DC | PRN

## 2022-02-21 MED ORDER — PANTOPRAZOLE SODIUM 40 MG PO TBEC
40.0000 mg | DELAYED_RELEASE_TABLET | Freq: Every day | ORAL | Status: DC
  Administered 2022-02-21 – 2022-02-22 (×2): 40 mg via ORAL
  Filled 2022-02-21 (×2): qty 1

## 2022-02-21 MED ORDER — IOHEXOL 300 MG/ML  SOLN
50.0000 mL | Freq: Once | INTRAMUSCULAR | Status: AC | PRN
  Administered 2022-02-21: 50 mL via INTRAVENOUS

## 2022-02-21 MED ORDER — VANCOMYCIN HCL 1.5 G IV SOLR
1500.0000 mg | Freq: Two times a day (BID) | INTRAVENOUS | Status: DC
  Administered 2022-02-21 – 2022-02-22 (×2): 1500 mg via INTRAVENOUS
  Filled 2022-02-21 (×5): qty 30

## 2022-02-21 MED ORDER — PRENATAL MULTIVITAMIN CH
1.0000 | ORAL_TABLET | Freq: Every day | ORAL | Status: DC
  Administered 2022-02-21 – 2022-02-22 (×2): 1 via ORAL
  Filled 2022-02-21 (×2): qty 1

## 2022-02-21 MED ORDER — DOCUSATE SODIUM 100 MG PO CAPS
100.0000 mg | ORAL_CAPSULE | Freq: Every day | ORAL | Status: DC
  Administered 2022-02-21 – 2022-02-22 (×2): 100 mg via ORAL
  Filled 2022-02-21 (×2): qty 1

## 2022-02-21 MED ORDER — RHO D IMMUNE GLOBULIN 1500 UNIT/2ML IJ SOSY
300.0000 ug | PREFILLED_SYRINGE | Freq: Once | INTRAMUSCULAR | Status: AC
  Administered 2022-02-21: 300 ug via INTRAMUSCULAR
  Filled 2022-02-21: qty 2

## 2022-02-21 NOTE — Progress Notes (Addendum)
ENT phone consult obtained.  Advised the following: ? ?-continuing with IV antibiotics until clinical signs of improvement, ie afebrile, decreased WBC and improving pain ?-when improved transition to Cleocin x 10 days ?-may also if needed add Decadron 8mg  q 8 x 3 doses ?-warm compress q 2hr ?-regular use of sialogugues and increased hydration ?-ideally often issue caused by poor dentition and will require treatment from a dentist ? ?Above reviewed with patient ? ? , DO ?Attending Obstetrician & Gynecologist, Faculty Practice ?[facility], [facility] ? ? ? ?

## 2022-02-21 NOTE — Telephone Encounter (Signed)
Pt called on call triage service, returned pt phone call advising she did right thing by reporting to MAU. Pt thanks [place] for calling and has no questions at this time.   ?

## 2022-02-21 NOTE — Progress Notes (Addendum)
Pharmacy Antibiotic Note ? ?Cassandra Roth is a 41 y.o. female admitted on 02/20/2022 at [redacted]w[redacted]d pregnant with right jaw pain .  Pharmacy has been consulted for Vancomycin dosing for parotitis. ? ?Plan:  Vancomycin 1250mg  IV x 1. ?Will adjust maintenance dose per protocol ?Trough goal 10-15 ? ? ?Height: 5\' 3"  (160 cm) ?IBW/kg (Calculated) : 52.4 ? ?Temp (24hrs), Avg:98.4 ?F (36.9 ?C), Min:98.2 ?F (36.8 ?C), Max:98.5 ?F (36.9 ?C) ? ?Recent Labs  ?Lab 02/14/22 ?0858 02/21/22 ?0008 02/21/22 ?0316  ?WBC 22.7*  --  22.7*  ?CREATININE  --  0.52  --   ?  ?Estimated Creatinine Clearance: 103.4 mL/min (by C-G formula based on SCr of 0.52 mg/dL).   ? ?Allergies  ?Allergen Reactions  ? Zoloft [Sertraline Hcl] Other (See Comments)  ?  Seizure  ? ? ?Antimicrobials this admission: ?Unasyn 3g IV Q6 >> 02/21/22 > ? ? ?Thank you for allowing pharmacy to be a part of this patient?s care. ? ?Roth, Cassandra Gunnels ?02/21/2022 6:25 AM ? ?

## 2022-02-21 NOTE — H&P (Addendum)
History      [id number]   Arrival date and time: 02/20/22 2145        Chief Complaint  Patient presents with   Decreased Fetal Movement   Jaw Pain   Abdominal Pain        HPI Cassandra Roth is a 41 y.o. at [redacted]w[redacted]d by LMP with PMHx notable for migraines, who presents for jaw pain, decreased fetal movement, & abdominal pain. Reports right jaw pain and swelling that started this evening.  She was sitting at a computer during work and noticed the pain.  Pain is improved without intervention but continues to be swollen.  Does report history of dental issues but currently denies any dental pain.  Denies ear pain, fever, sore throat, or trauma. Noticed decreased fetal movement earlier today which resolved upon arrival to [facility]U.  Also has had some intermittent abdominal pain that she cannot tell if it is contractions.  Not occurring frequently.  Denies urinary complaints, leaking of fluid, or vaginal bleeding.   OB History       Gravida  3   Para  2   Term  2   Preterm      AB      Living  2        SAB      IAB      Ectopic      Multiple      Live Births  2                   Past Medical History:  Diagnosis Date   Anxiety     Back pain     Migraine     Ovarian cyst     Seizures ([place])      after taking zoloft           Past Surgical History:  Procedure Laterality Date   OVARIAN CYST REMOVAL               Family History  Problem Relation Age of Onset   Hypertension Mother     Diabetes Mother     Kidney cancer Mother     Throat cancer Maternal Grandmother     Dementia Maternal Grandmother             Allergies  Allergen Reactions   Zoloft [Sertraline Hcl] Other (See Comments)      Seizure      No current facility-administered medications on file prior to encounter.          Current Outpatient Medications on File Prior to Encounter  Medication Sig Dispense Refill   Accu-Chek Softclix Lancets lancets 1 each by Other route 4 (four) times daily. Use  as instructed 100 each 12   Accu-Chek Softclix Lancets lancets 100 each by Other route 4 (four) times daily. 100 each 12   acetaminophen (TYLENOL) 325 MG tablet Take 2 tablets (650 mg total) by mouth every 4 (four) hours as needed (for pain scale < 4  OR  temperature  >/=  100.5 F).       aspirin EC 81 MG tablet Take 1 tablet (81 mg total) by mouth daily. Swallow whole. (Patient not taking: Reported on 01/29/2022) 60 tablet 4   Blood Glucose Monitoring Suppl (ACCU-CHEK GUIDE ME) w/Device KIT 1 kit by Does not apply route daily. 1 kit 0   Blood Glucose Monitoring Suppl (CONTOUR NEXT MONITOR) w/Device KIT 1 kit by Does not apply route as needed. 1 kit 0  Blood Pressure Monitoring (BLOOD PRESSURE KIT) DEVI 1 kit by Does not apply route once a week. (Patient not taking: Reported on 02/11/2022) 1 each 0   docusate sodium (COLACE) 100 MG capsule Take 1 capsule (100 mg total) by mouth daily. (Patient not taking: Reported on 02/11/2022) 10 capsule 0   glucose blood (ACCU-CHEK GUIDE) test strip Use 4 times daily. Please check blood sugar first thing in the morning before breakfast fasting and 2 hours after every meal. 100 each 12   Misc. Devices (GOJJI WEIGHT SCALE) MISC 1 Device by Does not apply route every 30 (thirty) days. (Patient not taking: Reported on 02/11/2022) 1 each 0   nicotine (NICODERM CQ - DOSED IN MG/24 HOURS) 21 mg/24hr patch Place 1 patch (21 mg total) onto the skin daily. (Patient not taking: Reported on 02/11/2022) 28 patch 0   ondansetron (ZOFRAN-ODT) 4 MG disintegrating tablet Take 1 tablet (4 mg total) by mouth every 8 (eight) hours as needed for nausea or vomiting. (Patient not taking: Reported on 02/11/2022) 20 tablet 0   Prenatal Vit-Fe Fumarate-FA (PRENATAL PLUS VITAMIN/MINERAL) 27-1 MG TABS Take 1 tablet by mouth daily.            ROS Pertinent positives and negative per HPI, all others reviewed and negative   Physical Exam    BP 127/69 (BP Location: Left Arm)    Pulse (!) 106     Temp 98.2 F (36.8 C) (Oral)    Resp 16    Ht $R'5\' 3"'Hh$  (1.6 m)    LMP 07/29/2021    SpO2 99%    BMI 37.06 kg/m    Patient Vitals for the past 24 hrs:   BP Temp Temp src Pulse Resp SpO2 Height  02/21/22 0256 127/69 98.2 F (36.8 C) Oral (!) 106 16 99 % --  02/20/22 2223 118/81 98.5 F (36.9 C) -- (!) 109 (!) 22 99 % $Re'5\' 3"'bCA$  (1.6 m)      Physical Exam Vitals and nursing note reviewed. Exam conducted with a chaperone present.  Constitutional:      Appearance: She is well-developed. She is not ill-appearing.  HENT:     Head:     Jaw: Tenderness and swelling present.     Right Ear: Tympanic membrane, ear canal and external ear normal. No mastoid tenderness.     Left Ear: Tympanic membrane, ear canal and external ear normal. No mastoid tenderness.     Mouth/Throat:     Dentition: Dental caries present. No gingival swelling or dental abscesses.  Pulmonary:     Effort: Pulmonary effort is normal. No respiratory distress.  Lymphadenopathy:     Cervical: No cervical adenopathy.  Skin:    General: Skin is warm and dry.  Neurological:     Mental Status: She is alert.  Psychiatric:        Mood and Affect: Mood normal.        Behavior: Behavior normal.      Cervical Exam Dilation: Closed Effacement (%): Thick Cervical Position: Posterior Station: -3 Exam by:: Jorje Guild NP   FHT Baseline 145, moderate variability, 15x15 accels, no decels Toco: none Cat: 1   Labs Lab Results Last 24 Hours       Results for orders placed or performed during the hospital encounter of 02/20/22 (from the past 24 hour(s))  Urinalysis, Routine w reflex microscopic     Status: Abnormal    Collection Time: 02/20/22 10:30 PM  Result Value Ref Range  Color, Urine YELLOW YELLOW    APPearance HAZY (A) CLEAR    Specific Gravity, Urine 1.021 1.005 - 1.030    pH 6.0 5.0 - 8.0    Glucose, UA NEGATIVE NEGATIVE mg/dL    Hgb urine dipstick SMALL (A) NEGATIVE    Bilirubin Urine NEGATIVE NEGATIVE     Ketones, ur NEGATIVE NEGATIVE mg/dL    Protein, ur 30 (A) NEGATIVE mg/dL    Nitrite NEGATIVE NEGATIVE    Leukocytes,Ua NEGATIVE NEGATIVE    RBC / HPF 21-50 0 - 5 RBC/hpf    WBC, UA 6-10 0 - 5 WBC/hpf    Bacteria, UA FEW (A) NONE SEEN    Squamous Epithelial / LPF 0-5 0 - 5    Mucus PRESENT      Ca Oxalate Crys, UA PRESENT    Comprehensive metabolic panel     Status: Abnormal    Collection Time: 02/21/22 12:08 AM  Result Value Ref Range    Sodium 136 135 - 145 mmol/L    Potassium 3.4 (L) 3.5 - 5.1 mmol/L    Chloride 106 98 - 111 mmol/L    CO2 21 (L) 22 - 32 mmol/L    Glucose, Bld 100 (H) 70 - 99 mg/dL    BUN 19 6 - 20 mg/dL    Creatinine, Ser 0.52 0.44 - 1.00 mg/dL    Calcium 9.3 8.9 - 10.3 mg/dL    Total Protein 6.1 (L) 6.5 - 8.1 g/dL    Albumin 2.6 (L) 3.5 - 5.0 g/dL    AST 10 (L) 15 - 41 U/L    ALT 14 0 - 44 U/L    Alkaline Phosphatase 107 38 - 126 U/L    Total Bilirubin 0.2 (L) 0.3 - 1.2 mg/dL    GFR, Estimated >60 >60 mL/min    Anion gap 9 5 - 15  CBC with Differential/Platelet     Status: Abnormal    Collection Time: 02/21/22  3:16 AM  Result Value Ref Range    WBC 22.7 (H) 4.0 - 10.5 K/uL    RBC 3.33 (L) 3.87 - 5.11 MIL/uL    Hemoglobin 10.9 (L) 12.0 - 15.0 g/dL    HCT 32.6 (L) 36.0 - 46.0 %    MCV 97.9 80.0 - 100.0 fL    MCH 32.7 26.0 - 34.0 pg    MCHC 33.4 30.0 - 36.0 g/dL    RDW 14.4 11.5 - 15.5 %    Platelets 430 (H) 150 - 400 K/uL    nRBC 0.0 0.0 - 0.2 %    Neutrophils Relative % 74 %    Neutro Abs 16.9 (H) 1.7 - 7.7 K/uL    Lymphocytes Relative 16 %    Lymphs Abs 3.6 0.7 - 4.0 K/uL    Monocytes Relative 6 %    Monocytes Absolute 1.4 (H) 0.1 - 1.0 K/uL    Eosinophils Relative 2 %    Eosinophils Absolute 0.4 0.0 - 0.5 K/uL    Basophils Relative 0 %    Basophils Absolute 0.1 0.0 - 0.1 K/uL    Immature Granulocytes 2 %    Abs Immature Granulocytes 0.40 (H) 0.00 - 0.07 K/uL        Imaging CT MAXILLOFACIAL W CONTRAST   Result Date: 02/21/2022 CLINICAL  DATA:  Right facial pain EXAM: CT MAXILLOFACIAL WITH CONTRAST TECHNIQUE: Multidetector CT imaging of the maxillofacial structures was performed with intravenous contrast. Multiplanar CT image reconstructions were also generated. RADIATION DOSE REDUCTION: This exam was  performed according to the departmental dose-optimization program which includes automated exposure control, adjustment of the mA and/or kV according to patient size and/or use of iterative reconstruction technique. CONTRAST:  9mL OMNIPAQUE IOHEXOL 300 MG/ML  SOLN COMPARISON:  None. FINDINGS: Osseous: Poor dentition.  No acute osseous lesion. Orbits: Negative. No traumatic or inflammatory finding. Sinuses: Clear. Soft tissues: There is mild fat stranding around the right parotid gland. No fluid collection. No sialolithiasis. Limited intracranial: No significant or unexpected finding. IMPRESSION: 1. Mild fat stranding around the right parotid gland, compatible with acute parotitis. No sialolithiasis or abscess. 2. Poor dentition. Electronically Signed   By: Ulyses Jarred M.D.   On: 02/21/2022 02:58     MAU Course  Procedures Lab Orders         Resp Panel by RT-PCR (Flu A&B, Covid) Nasopharyngeal Swab         Urinalysis, Routine w reflex microscopic         Comprehensive metabolic panel         CBC with Differential/Platelet       Meds ordered this encounter  Medications   iohexol (OMNIPAQUE) 300 MG/ML solution 50 mL    Imaging Orders         CT MAXILLOFACIAL W CONTRAST        MDM Decreased fetal movement: patient reports return of movement while in MAU & has reactive fetal tracing.  Abdominal pain: no contractions on monitor. Cervix closed/thick.   Right pre auricular swelling & point tenderness. Evaluated by Dr. Cy Blamer. CT ordered to evaluate for abscess & results showed acute parotitis. Based on presentation, exam, and imaging, she meets criteria for suppurative parotitis & will be admitted for IV antibiotics. Discussed with Dr.  Harolyn Rutherford - will get ID consult in the morning.  Assessment and Plan    1. Acute suppurative parotitis   2. [redacted] weeks gestation of pregnancy     -admit to [facility] unit -Antibiotics: unasyn 3 gm Q 6 hours & vancomycin per pharmacy consult (increased MRSA risk d/t recent hospitalization) -daily NSTs     Jorje Guild, NP 02/21/22 4:23 AM  Attestation of Attending Supervision of Advanced Practice Provider (PA/CNM/NP): Evaluation and management procedures were performed by the Advanced Practice Provider under my supervision and collaboration.  I have reviewed the Advanced Practice Provider's note and chart, and I agree with the management and plan. I have also made any necessary editorial changes.  Patient met and plan reviewed. Will consult with ID later today to ensure correct antibiotic regimen is being used.  Will monitor blood sugars, do daily NSTs.  Rhogam ordered for patient, she has not received it yet. Routine antenatal care.   Verita Schneiders, MD, [place] Attending [place], [facility] for [employer], [place]

## 2022-02-21 NOTE — Consult Note (Signed)
Pharmacy Antibiotic Note ? ?Cassandra Roth is a 41 y.o. female admitted on 02/20/2022 with  suppurative parotitis .  Pharmacy has been consulted for vancomycin dosing. ? ?Plan: ?Patient admitted with jaw pain and swelling. Mild fat stranding around the right parotid gland, compatible with acute parotitis was found on CT. No sialolithiasis or abscess were observed. Unasyn 3g q6h IV and Vancomycin 1250 mg IV once were initiated. Vancomycin was continued at 1500 mg IV q12h.  ? ?Pharmacy will continue to monitor labs, patient status, and order levels if indicated. Serum creatinine was ordered to ensure creatinine clearance stays stable as patient has/is receiving several potential nephrotoxic medications.  ? ? ?Height: 5\' 3"  (160 cm) ?IBW/kg (Calculated) : 52.4 ? ?Temp (24hrs), Avg:98.2 ?F (36.8 ?C), Min:98 ?F (36.7 ?C), Max:98.5 ?F (36.9 ?C) ? ?Recent Labs  ?Lab 02/21/22 ?0008 02/21/22 ?0316  ?WBC  --  22.7*  ?CREATININE 0.52  --   ?  ?Estimated Creatinine Clearance: 103.4 mL/min (by C-G formula based on SCr of 0.52 mg/dL).   ? ?Allergies  ?Allergen Reactions  ? Zoloft [Sertraline Hcl] Other (See Comments)  ?  Seizure  ? ? ?Antimicrobials this admission: ?3/3 Unasyn >> ?3/3 Vancomycin >> ? ?Thank you for allowing pharmacy to be a part of this patient?s care. ? ?04/23/22, PharmD ?02/21/2022 2:33 PM ? ?

## 2022-02-22 DIAGNOSIS — Z3A29 29 weeks gestation of pregnancy: Secondary | ICD-10-CM

## 2022-02-22 DIAGNOSIS — K1121 Acute sialoadenitis: Secondary | ICD-10-CM | POA: Diagnosis not present

## 2022-02-22 LAB — CBC
HCT: 31.6 % — ABNORMAL LOW (ref 36.0–46.0)
Hemoglobin: 10.6 g/dL — ABNORMAL LOW (ref 12.0–15.0)
MCH: 32.4 pg (ref 26.0–34.0)
MCHC: 33.5 g/dL (ref 30.0–36.0)
MCV: 96.6 fL (ref 80.0–100.0)
Platelets: 400 10*3/uL (ref 150–400)
RBC: 3.27 MIL/uL — ABNORMAL LOW (ref 3.87–5.11)
RDW: 14.6 % (ref 11.5–15.5)
WBC: 19.7 10*3/uL — ABNORMAL HIGH (ref 4.0–10.5)
nRBC: 0 % (ref 0.0–0.2)

## 2022-02-22 LAB — RH IG WORKUP (INCLUDES ABO/RH)
ABO/RH(D): A NEG
Gestational Age(Wks): 29
Unit division: 0

## 2022-02-22 LAB — CREATININE, SERUM
Creatinine, Ser: 0.53 mg/dL (ref 0.44–1.00)
GFR, Estimated: >60 mL/min (ref >60)

## 2022-02-22 MED ORDER — DEXAMETHASONE 4 MG PO TABS
8.0000 mg | ORAL_TABLET | Freq: Every day | ORAL | Status: DC
  Filled 2022-02-22: qty 2

## 2022-02-22 MED ORDER — OXYCODONE HCL 5 MG PO TABS: 5.0000 mg | ORAL_TABLET | Freq: Four times a day (QID) | ORAL | 0 refills | Status: AC | PRN

## 2022-02-22 MED ORDER — DEXAMETHASONE 4 MG PO TABS
4.0000 mg | ORAL_TABLET | Freq: Every day | ORAL | 0 refills | Status: AC
Start: 2022-02-22 — End: 2022-02-24

## 2022-02-22 MED ORDER — DEXAMETHASONE SODIUM PHOSPHATE 10 MG/ML IJ SOLN
8.0000 mg | Freq: Three times a day (TID) | INTRAMUSCULAR | Status: DC
  Administered 2022-02-22: 8 mg via INTRAVENOUS
  Filled 2022-02-22: qty 1

## 2022-02-22 MED ORDER — CLINDAMYCIN HCL 300 MG PO CAPS: 600.0000 mg | ORAL_CAPSULE | Freq: Two times a day (BID) | ORAL | 0 refills | Status: AC

## 2022-02-22 MED ORDER — DOCUSATE SODIUM 100 MG PO CAPS: 100.0000 mg | ORAL_CAPSULE | Freq: Every day | ORAL | 0 refills | Status: DC

## 2022-02-22 NOTE — Progress Notes (Signed)
2 hour postprandial CBG 118.  ?

## 2022-02-22 NOTE — Progress Notes (Signed)
Patient refuses blood sugar check stating that it was already done by the NT just after 5 am. No flowsheet data available for review. Patient states that fasting CBG was 97. ?

## 2022-02-22 NOTE — Progress Notes (Signed)
FACULTY PRACTICE ANTEPARTUM(COMPREHENSIVE) NOTE ? ?NEVE BRANSCOMB is a 41 y.o. [phone] with Estimated Date of Delivery: 05/05/22   By  LMP [redacted]w[redacted]d  who is admitted for acute parotitis.   ? ? ?Subjective: ?She notes the swelling has significantly improved, but still feeling tremendous pressure. ? ?Patient reports the fetal movement as active. ?Patient reports uterine contraction  activity as none. ?Patient reports  vaginal bleeding as none. ?Patient describes fluid per vagina as None. ? ?Vitals:  Blood pressure 118/71, pulse 98, temperature 98 ?F (36.7 ?C), temperature source Oral, resp. rate 17, height 5\' 3"  (1.6 m), last menstrual period 07/29/2021, SpO2 98 %, unknown if currently breastfeeding. ?Vitals:  ? 02/21/22 1931 02/21/22 2334 02/22/22 0330 02/22/22 0755  ?BP: (!) 116/56 116/60 122/65 118/71  ?Pulse: 89 90 88 98  ?Resp: 16 14 17 17   ?Temp: 98.3 ?F (36.8 ?C) 98 ?F (36.7 ?C) 98.1 ?F (36.7 ?C) 98 ?F (36.7 ?C)  ?TempSrc: Oral Oral Oral Oral  ?SpO2: 100% 98% 97% 98%  ?Height:      ? ?Physical Examination: ? General appearance - alert, well appearing, and in no distress and improved ?Mental status - normal mood, behavior, speech, dress, motor activity, and thought processes ?Face: Swelling of right cheek improved, non-tender to palpation ?Chest - CTAB ?Heart - normal rate and regular rhythm ?Abdomen - gravid, soft and non-tender ?Extremities - no edema, no calf tenderness bilaterally ?Skin - warm and dry ? ?Fetal Monitoring:  Baseline: 140 bpm, Variability: moderate, Accelerations: +15x15 accels, and Decelerations: Absent   reactive ? ?Labs:  ?Results for orders placed or performed during the hospital encounter of 02/20/22 (from the past 24 hour(s))  ?Glucose, capillary  ? Collection Time: 02/21/22 12:41 PM  ?Result Value Ref Range  ? Glucose-Capillary 91 70 - 99 mg/dL  ?Glucose, capillary  ? Collection Time: 02/21/22  3:34 PM  ?Result Value Ref Range  ? Glucose-Capillary 114 (H) 70 - 99 mg/dL  ?Glucose, capillary  ?  Collection Time: 02/21/22  8:32 PM  ?Result Value Ref Range  ? Glucose-Capillary 105 (H) 70 - 99 mg/dL  ?CBC  ? Collection Time: 02/22/22  5:50 AM  ?Result Value Ref Range  ? WBC 19.7 (H) 4.0 - 10.5 K/uL  ? RBC 3.27 (L) 3.87 - 5.11 MIL/uL  ? Hemoglobin 10.6 (L) 12.0 - 15.0 g/dL  ? HCT 31.6 (L) 36.0 - 46.0 %  ? MCV 96.6 80.0 - 100.0 fL  ? MCH 32.4 26.0 - 34.0 pg  ? MCHC 33.5 30.0 - 36.0 g/dL  ? RDW 14.6 11.5 - 15.5 %  ? Platelets 400 150 - 400 K/uL  ? nRBC 0.0 0.0 - 0.2 %  ?Creatinine, serum  ? Collection Time: 02/22/22  5:50 AM  ?Result Value Ref Range  ? Creatinine, Ser 0.53 0.44 - 1.00 mg/dL  ? GFR, Estimated >60 >60 mL/min  ? ? ?Imaging Studies:    ?CT MAXILLOFACIAL W CONTRAST ? ?Result Date: 02/21/2022 ?CLINICAL DATA:  Right facial pain EXAM: CT MAXILLOFACIAL WITH CONTRAST TECHNIQUE: Multidetector CT imaging of the maxillofacial structures was performed with intravenous contrast. Multiplanar CT image reconstructions were also generated. RADIATION DOSE REDUCTION: This exam was performed according to the departmental dose-optimization program which includes automated exposure control, adjustment of the mA and/or kV according to patient size and/or use of iterative reconstruction technique. CONTRAST:  29mL OMNIPAQUE IOHEXOL 300 MG/ML  SOLN COMPARISON:  None. FINDINGS: Osseous: Poor dentition.  No acute osseous lesion. Orbits: Negative. No traumatic or inflammatory finding.  Sinuses: Clear. Soft tissues: There is mild fat stranding around the right parotid gland. No fluid collection. No sialolithiasis. Limited intracranial: No significant or unexpected finding. IMPRESSION: 1. Mild fat stranding around the right parotid gland, compatible with acute parotitis. No sialolithiasis or abscess. 2. Poor dentition. Electronically Signed   By: Deatra Robinson M.D.   On: 02/21/2022 02:58    ? ?Medications:  Scheduled ? dexamethasone (DECADRON) injection  8 mg Intravenous Q8H  ? docusate sodium  100 mg Oral Daily  ? pantoprazole  40  mg Oral Daily  ? prenatal multivitamin  1 tablet Oral Q1200  ? ?I have reviewed the patient's current medications. ? ?ASSESSMENT: ?[id number] [redacted]w[redacted]d Estimated Date of Delivery: 05/05/22  ?Acute parotitis ?GDMA1 ? ?PLAN: ?1) Acute parotitis ?-leukocytosis improving, remains afebrile ?-due to continued pressure, plan for short course of steroids ?-transition to oral antibiotics ? ?2) GDMA1 ?-continue fasting and 2hPP ? ?DISPO: Care reviewed with dentistry, agreed with plan to transition to outpatient management.  Follow up for routine OB and dentist appt ASAP ? ?Alessandra Bevels Zakariah Urwin ?02/22/2022,10:48 AM ? ? ? ?  ?

## 2022-02-22 NOTE — Discharge Summary (Signed)
Physician Discharge Summary  ?Patient ID: ?Cassandra Roth ?MRN: [id number] ?DOB/AGE: [birth year] 41 y.o. ? ?Admit date: 02/20/2022 ?Discharge date: 02/22/2022 ? ?Admission Diagnoses: Acute parotitis, IUP @ 29wk ? ?Discharge Diagnoses:  ?Principal Problem: ?  Acute suppurative parotitis ?Active Problems: ?  Supervision of high-risk pregnancy ?  Gestational diabetes mellitus (GDM) affecting pregnancy, antepartum ?  [redacted] weeks gestation of pregnancy ? ? ?Discharged Condition: stable ? ?Hospital Course: 40yo G3P2002@[redacted]w[redacted]d  admitted due to acute parotitis.  Treated with IV antibiotics and IV steroids.  Noted improvement with treatment.  Transitioned to oral medication with plan for outpatient follow up. ? ?Consults:  ENT ? ?Significant Diagnostic Studies: labs:  ?Results for orders placed or performed during the hospital encounter of 02/20/22 (from the past 24 hour(s))  ?Glucose, capillary     Status: None  ? Collection Time: 02/21/22 12:41 PM  ?Result Value Ref Range  ? Glucose-Capillary 91 70 - 99 mg/dL  ?Glucose, capillary     Status: Abnormal  ? Collection Time: 02/21/22  3:34 PM  ?Result Value Ref Range  ? Glucose-Capillary 114 (H) 70 - 99 mg/dL  ?Glucose, capillary     Status: Abnormal  ? Collection Time: 02/21/22  8:32 PM  ?Result Value Ref Range  ? Glucose-Capillary 105 (H) 70 - 99 mg/dL  ?CBC     Status: Abnormal  ? Collection Time: 02/22/22  5:50 AM  ?Result Value Ref Range  ? WBC 19.7 (H) 4.0 - 10.5 K/uL  ? RBC 3.27 (L) 3.87 - 5.11 MIL/uL  ? Hemoglobin 10.6 (L) 12.0 - 15.0 g/dL  ? HCT 31.6 (L) 36.0 - 46.0 %  ? MCV 96.6 80.0 - 100.0 fL  ? MCH 32.4 26.0 - 34.0 pg  ? MCHC 33.5 30.0 - 36.0 g/dL  ? RDW 14.6 11.5 - 15.5 %  ? Platelets 400 150 - 400 K/uL  ? nRBC 0.0 0.0 - 0.2 %  ?Creatinine, serum     Status: None  ? Collection Time: 02/22/22  5:50 AM  ?Result Value Ref Range  ? Creatinine, Ser 0.53 0.44 - 1.00 mg/dL  ? GFR, Estimated >60 >60 mL/min  ? ?CT: IMPRESSION: ?1. Mild fat stranding around the right parotid gland,  compatible ?with acute parotitis. No sialolithiasis or abscess. ?2. Poor dentition. ? ?Treatments: IV hydration and antibiotics: Unasyn/Vanc ? ?Discharge Exam: ?Blood pressure 118/71, pulse 98, temperature 98 ?F (36.7 ?C), temperature source Oral, resp. rate 17, height 5\' 3"  (1.6 m), last menstrual period 07/29/2021, SpO2 98 %, unknown if currently breastfeeding. ?Physical Examination: ?General appearance - alert, well appearing, and in no distress and improved ?Mental status - normal mood, behavior, speech, dress, motor activity, and thought processes ?Face: Swelling of right cheek improved, non-tender to palpation ?Chest - CTAB ?Heart - normal rate and regular rhythm ?Abdomen - gravid, soft and non-tender ?Extremities - no edema, no calf tenderness bilaterally ?Skin - warm and dry ?  ?Fetal Monitoring:  Baseline: 140 bpm, Variability: moderate, Accelerations: +15x15 accels, and Decelerations: Absent   reactive ?Disposition: Discharge disposition: 01-Home or Self Care ? ? ? ? ? ? ? ?Allergies as of 02/22/2022   ? ?   Reactions  ? Zoloft [sertraline Hcl] Other (See Comments)  ? Seizure  ? ?  ? ?  ?Medication List  ?  ? ?TAKE these medications   ? ?Accu-Chek Guide test strip ?Generic drug: glucose blood ?Use 4 times daily. Please check blood sugar first thing in the morning before breakfast fasting and 2 hours after every  meal. ?  ?Accu-Chek Softclix Lancets lancets ?1 each by Other route 4 (four) times daily. Use as instructed ?  ?Accu-Chek Softclix Lancets lancets ?100 each by Other route 4 (four) times daily. ?  ?acetaminophen 325 MG tablet ?Commonly known as: TYLENOL ?Take 2 tablets (650 mg total) by mouth every 4 (four) hours as needed (for pain scale < 4  OR  temperature  >/=  100.5 F). ?  ?aspirin EC 81 MG tablet ?Take 1 tablet (81 mg total) by mouth daily. Swallow whole. ?  ?Blood Pressure Kit Devi ?1 kit by Does not apply route once a week. ?  ?clindamycin 300 MG capsule ?Commonly known as: CLEOCIN ?Take 2  capsules (600 mg total) by mouth in the morning and at bedtime for 10 days. ?  ?Contour Next Monitor w/Device Kit ?1 kit by Does not apply route as needed. ?  ?Accu-Chek Guide Me w/Device Kit ?1 kit by Does not apply route daily. ?  ?dexamethasone 4 MG tablet ?Commonly known as: Decadron ?Take 1 tablet (4 mg total) by mouth daily for 2 days. ?  ?docusate sodium 100 MG capsule ?Commonly known as: COLACE ?Take 1 capsule (100 mg total) by mouth daily. ?Start taking on: February 23, 2022 ?  ?Gojji Weight Scale Misc ?1 Device by Does not apply route every 30 (thirty) days. ?  ?nicotine 21 mg/24hr patch ?Commonly known as: NICODERM CQ - dosed in mg/24 hours ?Place 1 patch (21 mg total) onto the skin daily. ?  ?ondansetron 4 MG disintegrating tablet ?Commonly known as: ZOFRAN-ODT ?Take 1 tablet (4 mg total) by mouth every 8 (eight) hours as needed for nausea or vomiting. ?  ?oxyCODONE 5 MG immediate release tablet ?Commonly known as: Oxy IR/ROXICODONE ?Take 1 tablet (5 mg total) by mouth every 6 (six) hours as needed for up to 5 days for moderate pain or severe pain. ?  ?Prenatal Plus Vitamin/Mineral 27-1 MG Tabs ?Take 1 tablet by mouth daily. ?  ? ?  ? ? ? ?Signed: ?Annalee Genta ?02/22/2022, 11:12 AM ? ? ?

## 2022-02-22 NOTE — Discharge Instructions (Signed)
Please reach out to a dentist as soon as possible.  Here is a list of dentist to try: ? ?[facility] ?[address] ?[place], [place] ?[phone] ? ?[facility] ?[address] ?[place],  [id number] ?

## 2022-02-24 LAB — GLUCOSE, CAPILLARY
Glucose-Capillary: 87 mg/dL (ref 70–99)
Glucose-Capillary: 114 mg/dL — ABNORMAL HIGH (ref 70–99)

## 2022-02-25 ENCOUNTER — Other Ambulatory Visit: Payer: Self-pay

## 2022-02-25 ENCOUNTER — Ambulatory Visit (INDEPENDENT_AMBULATORY_CARE_PROVIDER_SITE_OTHER): Payer: Medicaid Other | Admitting: Family Medicine

## 2022-02-25 ENCOUNTER — Encounter: Payer: Self-pay | Admitting: Family Medicine

## 2022-02-25 VITALS — BP 133/78 | HR 101 | Wt 215.8 lb

## 2022-02-25 DIAGNOSIS — K1121 Acute sialoadenitis: Secondary | ICD-10-CM

## 2022-02-25 DIAGNOSIS — O24419 Gestational diabetes mellitus in pregnancy, unspecified control: Secondary | ICD-10-CM

## 2022-02-25 DIAGNOSIS — N200 Calculus of kidney: Secondary | ICD-10-CM

## 2022-02-25 DIAGNOSIS — Z3A30 30 weeks gestation of pregnancy: Secondary | ICD-10-CM

## 2022-02-25 DIAGNOSIS — O0993 Supervision of high risk pregnancy, unspecified, third trimester: Secondary | ICD-10-CM

## 2022-02-25 NOTE — Progress Notes (Signed)
Patient presents for ROB. C/o left sided flank 6/10 pain. Reports hx of kidney stones and feels like that is the pain she experiencing.  ?

## 2022-02-25 NOTE — Progress Notes (Signed)
? ? ?  Subjective:  ?Cassandra Roth is a 41 y.o. G3P2002 at [redacted]w[redacted]d being seen today for ongoing prenatal care.  She is currently monitored for the following issues for this high-risk pregnancy and has Supervision of high-risk pregnancy; Advanced maternal age in multigravida; Leukocytosis; Nephrolithiasis; Cigarette nicotine dependence without complication; Gestational diabetes mellitus (GDM) affecting pregnancy, antepartum; Acute suppurative parotitis; and [redacted] weeks gestation of pregnancy on their problem list. ? ?Patient reports left flank pain. This has been going on for the past two or so days. Reports it feels identical to previous kidney stones trying to pass. Denies any fever, dysuria, urinary urgency or new onset frequency. Pain has been tolerable thus far. Contractions: Not present. Vag. Bleeding: None.  Movement: Present. Denies leaking of fluid.  ? ?The following portions of the patient's history were reviewed and updated as appropriate: allergies, current medications, past family history, past medical history, past social history, past surgical history and problem list.  ? ?Objective:  ? ?Vitals:  ? 02/25/22 1618  ?BP: 133/78  ?Pulse: (!) 101  ?Weight: 215 lb 12.8 oz (97.9 kg)  ? ? ?Fetal Status: Fetal Heart Rate (bpm): 147 Fundal Height: 31 cm Movement: Present    ? ?General:  Alert, oriented and cooperative. Patient is in no acute distress.  ?Skin: Skin is warm and dry. No rash noted.   ?Cardiovascular: Normal heart rate noted  ?Respiratory: Normal respiratory effort, no problems with respiration noted  ?Abdomen: Soft, gravid, appropriate for gestational age. Pain/Pressure: Absent  L CVA tenderness present. Negative on the R.   ?Pelvic:  Cervical exam deferred        ?Extremities: Normal range of motion.  Edema: Trace  ?Mental Status: Normal mood and affect. Normal behavior. Normal judgment and thought content.  ? ? ?Assessment and Plan:  ?Pregnancy: [id number] at [redacted]w[redacted]d ? ?1. Supervision of high risk pregnancy  in third trimester ? ?2. [redacted] weeks gestation of pregnancy ?Follow up US scheduled for 3/20  ? ?3. Nephrolithiasis ?Presentation most consistent with a renal stone. Appears comfortable, but does have L CVA tenderness c/s previous stones she has had. Without any urinary sx or fever, doubt pyelo. Discussed strict MAU precautions.  ?- Urinalysis, Routine w reflex microscopic ? ?4. Acute suppurative parotitis ?Resolved, recently admitted with IV antibiotics/steroids.  ? ?5. Gestational diabetes mellitus (GDM) affecting pregnancy, antepartum ?Fasting CBGs elevated (95-100), however slowly improving after course of steroids. If still elevated, should consider starting medication.   ? ?Preterm labor symptoms and general obstetric precautions including but not limited to vaginal bleeding, contractions, leaking of fluid and fetal movement were reviewed in detail with the patient. ?Please refer to After Visit Summary for other counseling recommendations.  ? ?Return in about 2 weeks (around 03/11/2022) for [place]. Or to MAU sooner if needed.  ? ? ?Patriciaann Clan, DO ?

## 2022-02-26 ENCOUNTER — Encounter: Payer: Self-pay | Admitting: *Deleted

## 2022-02-26 LAB — URINALYSIS, ROUTINE W REFLEX MICROSCOPIC
Bilirubin, UA: NEGATIVE
Glucose, UA: NEGATIVE
Ketones, UA: NEGATIVE
Leukocytes,UA: NEGATIVE
Nitrite, UA: NEGATIVE
Specific Gravity, UA: 1.012 (ref 1.005–1.030)
Urobilinogen, Ur: 0.2 mg/dL (ref 0.2–1.0)
pH, UA: 7.5 (ref 5.0–7.5)

## 2022-02-26 LAB — MICROSCOPIC EXAMINATION
Bacteria, UA: NONE SEEN
Casts: NONE SEEN /lpf
Epithelial Cells (non renal): >10 /hpf — AB (ref 0–10)

## 2022-03-10 ENCOUNTER — Ambulatory Visit: Payer: No Typology Code available for payment source | Attending: Maternal & Fetal Medicine

## 2022-03-10 ENCOUNTER — Other Ambulatory Visit: Payer: Self-pay | Admitting: *Deleted

## 2022-03-10 ENCOUNTER — Other Ambulatory Visit: Payer: Self-pay

## 2022-03-10 ENCOUNTER — Ambulatory Visit: Payer: No Typology Code available for payment source | Admitting: *Deleted

## 2022-03-10 ENCOUNTER — Encounter: Payer: Self-pay | Admitting: *Deleted

## 2022-03-10 ENCOUNTER — Ambulatory Visit
Payer: No Typology Code available for payment source | Attending: Obstetrics and Gynecology | Admitting: Obstetrics and Gynecology

## 2022-03-10 VITALS — BP 131/74 | HR 105

## 2022-03-10 DIAGNOSIS — O99333 Smoking (tobacco) complicating pregnancy, third trimester: Secondary | ICD-10-CM | POA: Diagnosis not present

## 2022-03-10 DIAGNOSIS — O99320 Drug use complicating pregnancy, unspecified trimester: Secondary | ICD-10-CM

## 2022-03-10 DIAGNOSIS — O3503X0 Maternal care for (suspected) central nervous system malformation or damage in fetus, choroid plexus cysts, not applicable or unspecified: Secondary | ICD-10-CM | POA: Diagnosis not present

## 2022-03-10 DIAGNOSIS — O24419 Gestational diabetes mellitus in pregnancy, unspecified control: Secondary | ICD-10-CM

## 2022-03-10 DIAGNOSIS — O24410 Gestational diabetes mellitus in pregnancy, diet controlled: Secondary | ICD-10-CM

## 2022-03-10 DIAGNOSIS — O358XX0 Maternal care for other (suspected) fetal abnormality and damage, not applicable or unspecified: Secondary | ICD-10-CM | POA: Diagnosis not present

## 2022-03-10 DIAGNOSIS — O0993 Supervision of high risk pregnancy, unspecified, third trimester: Secondary | ICD-10-CM

## 2022-03-10 DIAGNOSIS — F17200 Nicotine dependence, unspecified, uncomplicated: Secondary | ICD-10-CM

## 2022-03-10 DIAGNOSIS — O09523 Supervision of elderly multigravida, third trimester: Secondary | ICD-10-CM

## 2022-03-10 DIAGNOSIS — F1920 Other psychoactive substance dependence, uncomplicated: Secondary | ICD-10-CM

## 2022-03-10 DIAGNOSIS — Z3A32 32 weeks gestation of pregnancy: Secondary | ICD-10-CM | POA: Diagnosis not present

## 2022-03-10 DIAGNOSIS — Z362 Encounter for other antenatal screening follow-up: Secondary | ICD-10-CM

## 2022-03-10 NOTE — Progress Notes (Signed)
Maternal-Fetal Medicine  ? ?Name: Cassandra Roth ?DOB: Apr 30, 1981 ?MRN: [id number] ?Referring Provider: Darrelyn Hillock, DO ? ?I had the pleasure of seeing [name] today at the [facility]. She is G3 P2002 at 32-weeks' gestation and is here for fetal growth assessment. ?She has a diagnosis of gestational diabetes. ?Patient checks her blood glucose regularly and reports her fasting levels are below 95 mg/DL prandial (1 hour) levels or between 120 and 140 mg/DL.  Diabetes is well controlled on diet. ?Blood pressure today at her office is 131/74 mmHg. ? ?Obstetric history significant for 2 term vaginal deliveries. ?On fetal anatomy scan, unilateral choroid plexus cyst was seen. ? ?Ultrasound ?Fetal growth is appropriate for gestational age.  Amniotic fluid is normal good fetal activity seen.  Intracranial structures appear normal.  Choroid plexus cyst has resolved ?I reassured the patient of the findings. ? ?Gestational diabetes ?I explained the diagnosis of gestational diabetes.  I emphasized the importance of good blood glucose control to prevent adverse fetal or neonatal outcomes.  I discussed blood glucose normal values. Patient checks her blood glucose regularly and is well motivated. ?Possible complications of gestational diabetes include fetal macrosomia, shoulder dystocia and birth injuries, stillbirth (in poorly controlled diabetes) and neonatal respiratory syndrome and other complications. ? ?In about 85% of cases, gestational diabetes is well controlled by diet alone.  Exercise reduces the need for insulin.  Medical treatment includes oral hypoglycemics or insulin. ? ?Timing of delivery: In well-controlled diabetes on diet, patient can be delivered at 24- or 40-weeks' gestation. Vaginal delivery is not contraindicated. ?Type 2 diabetes develops in up to 50% of women with GDM. I recommend postpartum screening with 75-g glucose load at 6 to 12 weeks after delivery. ?Recommendations ?-An  appointment was made for her to return in 4 weeks for fetal growth assessment. ?-If patient requires metformin or insulin, I recommend we initiate weekly BPP till delivery. ? ?Thank you for consultation.  If you have any questions or concerns, please contact me the [facility].  Consultation including face-to-face (more than 50%) counseling 30 minutes. ? ?

## 2022-03-11 ENCOUNTER — Ambulatory Visit (INDEPENDENT_AMBULATORY_CARE_PROVIDER_SITE_OTHER): Payer: No Typology Code available for payment source | Admitting: Obstetrics and Gynecology

## 2022-03-11 VITALS — BP 144/79 | HR 111 | Wt 216.0 lb

## 2022-03-11 DIAGNOSIS — O163 Unspecified maternal hypertension, third trimester: Secondary | ICD-10-CM

## 2022-03-11 DIAGNOSIS — O1493 Unspecified pre-eclampsia, third trimester: Secondary | ICD-10-CM | POA: Insufficient documentation

## 2022-03-11 DIAGNOSIS — Z3A32 32 weeks gestation of pregnancy: Secondary | ICD-10-CM

## 2022-03-11 DIAGNOSIS — O24419 Gestational diabetes mellitus in pregnancy, unspecified control: Secondary | ICD-10-CM

## 2022-03-11 DIAGNOSIS — O09523 Supervision of elderly multigravida, third trimester: Secondary | ICD-10-CM

## 2022-03-11 DIAGNOSIS — O0993 Supervision of high risk pregnancy, unspecified, third trimester: Secondary | ICD-10-CM

## 2022-03-11 NOTE — Progress Notes (Signed)
? ?  PRENATAL VISIT NOTE ? ?Subjective:  ?Cassandra Roth is a 41 y.o. G3P2002 at [redacted]w[redacted]d being seen today for ongoing prenatal care.  She is currently monitored for the following issues for this high-risk pregnancy and has Supervision of high-risk pregnancy; Advanced maternal age in multigravida; Leukocytosis; Nephrolithiasis; Cigarette nicotine dependence without complication; Gestational diabetes mellitus (GDM) affecting pregnancy, antepartum; Acute suppurative parotitis; [redacted] weeks gestation of pregnancy; and Elevated blood pressure affecting pregnancy in third trimester, antepartum on their problem list. ? ?Patient doing well with no acute concerns today. She reports no complaints.  Contractions: Not present. Vag. Bleeding: None.  Movement: Present. Denies leaking of fluid.  ?Pt denies headache, visual changes , and RUQ pain. ? ?The following portions of the patient's history were reviewed and updated as appropriate: allergies, current medications, past family history, past medical history, past social history, past surgical history and problem list. Problem list updated. ? ?Objective:  ? ?Vitals:  ? 03/11/22 1528  ?BP: (!) 144/79  ?Pulse: (!) 111  ?Weight: 216 lb (98 kg)  ? ? ?Fetal Status: Fetal Heart Rate (bpm): 134 Fundal Height: 33 cm Movement: Present    ? ?General:  Alert, oriented and cooperative. Patient is in no acute distress.  ?Skin: Skin is warm and dry. No rash noted.   ?Cardiovascular: Normal heart rate noted  ?Respiratory: Normal respiratory effort, no problems with respiration noted  ?Abdomen: Soft, gravid, appropriate for gestational age.  Pain/Pressure: Absent     ?Pelvic: Cervical exam deferred        ?Extremities: Normal range of motion.  Edema: None  ?Mental Status:  Normal mood and affect. Normal behavior. Normal judgment and thought content.  ? ?Assessment and Plan:  ?Pregnancy: G3P2002 at [redacted]w[redacted]d ? ?1. Gestational diabetes mellitus (GDM) affecting pregnancy, antepartum ?Pt notes FBS: 88 ?PPBS:  122-136, did not bring blood sugars to office ?Continue with diet management ? ?2. [redacted] weeks gestation of pregnancy ? ? ?3. Supervision of high risk pregnancy in third trimester ?Continue routine care ? ?4. Multigravida of advanced maternal age in third trimester ?Pt has follow up growth scan in 4 weeks ? ?5. Elevated blood pressure affecting pregnancy in third trimester, antepartum ?Pt denies any s/sx of preeclampsia, will still check labs ? ?- CBC ?- Comprehensive metabolic panel ?- Protein / creatinine ratio, urine ? ?Preterm labor symptoms and general obstetric precautions including but not limited to vaginal bleeding, contractions, leaking of fluid and fetal movement were reviewed in detail with the patient. ? ?Please refer to After Visit Summary for other counseling recommendations.  ? ?Return in about 2 weeks (around 03/25/2022) for [facility], in person. ? ? ?Lynnda Shields, MD ?Faculty Attending ?[facility] ?  ?

## 2022-03-11 NOTE — Progress Notes (Signed)
ROB 32.1 wks ? ?Has not had diabetes education due to inability to take off more time from work. But is checking CBGs fasting and 1 hour post prandial. Discussed importance of managing CBG and implications for fetus and delivery. Has had MFM [place] recently. ? ?Has not been taking ASA, states my blood pressure hasn't been high at all. Education provided. ? ?Still smoking, sts nicotine patches don't stick. ?

## 2022-03-12 LAB — COMPREHENSIVE METABOLIC PANEL
ALT: 12 IU/L (ref 0–32)
AST: 6 IU/L (ref 0–40)
Albumin/Globulin Ratio: 1.3 (ref 1.2–2.2)
Albumin: 3.5 g/dL — ABNORMAL LOW (ref 3.8–4.8)
Alkaline Phosphatase: 150 IU/L — ABNORMAL HIGH (ref 44–121)
BUN/Creatinine Ratio: 16 (ref 9–23)
BUN: 7 mg/dL (ref 6–20)
Bilirubin Total: <0.2 mg/dL (ref 0.0–1.2)
CO2: 20 mmol/L (ref 20–29)
Calcium: 9.7 mg/dL (ref 8.7–10.2)
Chloride: 107 mmol/L — ABNORMAL HIGH (ref 96–106)
Creatinine, Ser: 0.44 mg/dL — ABNORMAL LOW (ref 0.57–1.00)
Globulin, Total: 2.6 g/dL (ref 1.5–4.5)
Glucose: 93 mg/dL (ref 70–99)
Potassium: 4.1 mmol/L (ref 3.5–5.2)
Sodium: 142 mmol/L (ref 134–144)
Total Protein: 6.1 g/dL (ref 6.0–8.5)
eGFR: 126 mL/min/{1.73_m2} (ref >59)

## 2022-03-12 LAB — CBC
Hematocrit: 35.1 % (ref 34.0–46.6)
Hemoglobin: 12.2 g/dL (ref 11.1–15.9)
MCH: 32.5 pg (ref 26.6–33.0)
MCHC: 34.8 g/dL (ref 31.5–35.7)
MCV: 94 fL (ref 79–97)
Platelets: 527 10*3/uL — ABNORMAL HIGH (ref 150–450)
RBC: 3.75 x10E6/uL — ABNORMAL LOW (ref 3.77–5.28)
RDW: 13.4 % (ref 11.7–15.4)
WBC: 21.4 10*3/uL (ref 3.4–10.8)

## 2022-03-13 LAB — PROTEIN / CREATININE RATIO, URINE
Creatinine, Urine: 138.3 mg/dL
Protein, Ur: 83.8 mg/dL
Protein/Creat Ratio: 606 mg/g creat — ABNORMAL HIGH (ref 0–200)

## 2022-03-20 ENCOUNTER — Inpatient Hospital Stay (HOSPITAL_COMMUNITY)
Admission: AD | Admit: 2022-03-20 | Discharge: 2022-03-20 | Disposition: A | Discharge status: Home / Self Care | Payer: No Typology Code available for payment source | Attending: Obstetrics & Gynecology | Admitting: Obstetrics & Gynecology

## 2022-03-20 ENCOUNTER — Encounter (HOSPITAL_COMMUNITY): Payer: Self-pay | Admitting: Obstetrics & Gynecology

## 2022-03-20 ENCOUNTER — Other Ambulatory Visit: Payer: Self-pay

## 2022-03-20 DIAGNOSIS — Z3A33 33 weeks gestation of pregnancy: Secondary | ICD-10-CM | POA: Diagnosis not present

## 2022-03-20 DIAGNOSIS — O1493 Unspecified pre-eclampsia, third trimester: Secondary | ICD-10-CM | POA: Diagnosis present

## 2022-03-20 DIAGNOSIS — O09523 Supervision of elderly multigravida, third trimester: Secondary | ICD-10-CM | POA: Insufficient documentation

## 2022-03-20 LAB — PROTEIN / CREATININE RATIO, URINE
Creatinine, Urine: 75.45 mg/dL
Protein Creatinine Ratio: 0.56 mg/mg{Cre} — ABNORMAL HIGH (ref 0.00–0.15)
Total Protein, Urine: 42 mg/dL

## 2022-03-20 LAB — COMPREHENSIVE METABOLIC PANEL
ALT: 13 U/L (ref 0–44)
AST: 10 U/L — ABNORMAL LOW (ref 15–41)
Albumin: 2.4 g/dL — ABNORMAL LOW (ref 3.5–5.0)
Alkaline Phosphatase: 136 U/L — ABNORMAL HIGH (ref 38–126)
Anion gap: 8 (ref 5–15)
BUN: 6 mg/dL (ref 6–20)
CO2: 19 mmol/L — ABNORMAL LOW (ref 22–32)
Calcium: 8.9 mg/dL (ref 8.9–10.3)
Chloride: 109 mmol/L (ref 98–111)
Creatinine, Ser: 0.49 mg/dL (ref 0.44–1.00)
GFR, Estimated: >60 mL/min (ref >60)
Glucose, Bld: 98 mg/dL (ref 70–99)
Potassium: 3.6 mmol/L (ref 3.5–5.1)
Sodium: 136 mmol/L (ref 135–145)
Total Bilirubin: 0.2 mg/dL — ABNORMAL LOW (ref 0.3–1.2)
Total Protein: 5.9 g/dL — ABNORMAL LOW (ref 6.5–8.1)

## 2022-03-20 LAB — URINALYSIS, ROUTINE W REFLEX MICROSCOPIC
Bilirubin Urine: NEGATIVE
Glucose, UA: NEGATIVE mg/dL
Ketones, ur: NEGATIVE mg/dL
Nitrite: NEGATIVE
Protein, ur: 30 mg/dL — AB
Specific Gravity, Urine: 1.011 (ref 1.005–1.030)
pH: 6 (ref 5.0–8.0)

## 2022-03-20 LAB — CBC
HCT: 33.5 % — ABNORMAL LOW (ref 36.0–46.0)
Hemoglobin: 11.5 g/dL — ABNORMAL LOW (ref 12.0–15.0)
MCH: 32.5 pg (ref 26.0–34.0)
MCHC: 34.3 g/dL (ref 30.0–36.0)
MCV: 94.6 fL (ref 80.0–100.0)
Platelets: 501 10*3/uL — ABNORMAL HIGH (ref 150–400)
RBC: 3.54 MIL/uL — ABNORMAL LOW (ref 3.87–5.11)
RDW: 14.6 % (ref 11.5–15.5)
WBC: 21.3 10*3/uL — ABNORMAL HIGH (ref 4.0–10.5)
nRBC: 0 % (ref 0.0–0.2)

## 2022-03-20 MED ORDER — BUTALBITAL-APAP-CAFFEINE 50-325-40 MG PO TABS
2.0000 | ORAL_TABLET | Freq: Once | ORAL | Status: AC
  Administered 2022-03-20: 2 via ORAL
  Filled 2022-03-20: qty 2

## 2022-03-20 MED ORDER — DIPHENHYDRAMINE HCL 50 MG/ML IJ SOLN
25.0000 mg | Freq: Once | INTRAMUSCULAR | Status: AC
  Administered 2022-03-20: 25 mg via INTRAVENOUS
  Filled 2022-03-20: qty 1

## 2022-03-20 MED ORDER — LACTATED RINGERS IV BOLUS
1000.0000 mL | Freq: Once | INTRAVENOUS | Status: AC
  Administered 2022-03-20: 1000 mL via INTRAVENOUS

## 2022-03-20 MED ORDER — METOCLOPRAMIDE HCL 5 MG/ML IJ SOLN
10.0000 mg | Freq: Once | INTRAMUSCULAR | Status: AC
  Administered 2022-03-20: 10 mg via INTRAVENOUS
  Filled 2022-03-20: qty 2

## 2022-03-20 NOTE — MAU Note (Signed)
PT SAYS  1 WEEK AGO- WAS TESTED AT OFFICE FOR PRE-E --BP WAS HIGH- MONITOR FOR NEW S/S ?TODAY- 168/103, HAS H/A- WORSE NOW- NO MEDS - PAIN-6 ?SLIGHT BLURRY VISION ?NO EPIGASTRIC PAIN ?HAS FELT AN OCC UC ? ? ?

## 2022-03-20 NOTE — Discharge Instructions (Signed)

## 2022-03-20 NOTE — MAU Provider Note (Signed)
?History  ?  ? ?CSN: [id number] ? ?Arrival date and time: 03/20/22 2043 ? ? Event Date/Time  ? First Provider Initiated Contact with Patient 03/20/22 2112   ?  ? ?Chief Complaint  ?Patient presents with  ? Headache  ? ?HPI ?Cassandra Roth is a 41 y.o. G3P2002 at [redacted]w[redacted]d who presents for evaluation of elevated blood pressures and a headache. She states she was feeling generally unwell today so she checked her blood pressures throughout the day. She reports they were 140-160s/90-100s. She reports she has a squeezing headache all over her head that she rates a 6/10. She has not tried anything for the pain. She reports she has a hx of headaches and this feels similar to her normal headaches. She reports eating and drinking normally today. She reports some intermittent blurred vision. She denies any RUQ pain. ? ?She first had elevated blood pressures last week at her prenatal appointment. She had baseline labs done with an elevated PC ratio.  ? ?OB History   ? ? Gravida  ?3  ? Para  ?2  ? Term  ?2  ? Preterm  ?   ? AB  ?   ? Living  ?2  ?  ? ? SAB  ?   ? IAB  ?   ? Ectopic  ?   ? Multiple  ?   ? Live Births  ?2  ?   ?  ?  ? ? ?Past Medical History:  ?Diagnosis Date  ? Anxiety   ? Back pain   ? Migraine   ? Ovarian cyst   ? Seizures ([place])   ? after taking zoloft  ? ? ?Past Surgical History:  ?Procedure Laterality Date  ? OVARIAN CYST REMOVAL    ? ? ?Family History  ?Problem Relation Age of Onset  ? Hypertension Mother   ? Diabetes Mother   ? Kidney cancer Mother   ? Throat cancer Maternal Grandmother   ? Dementia Maternal Grandmother   ? ? ?Social History  ? ?Tobacco Use  ? Smoking status: Every Day  ?  Packs/day: 0.50  ?  Types: Cigarettes  ? Smokeless tobacco: Never  ?Vaping Use  ? Vaping Use: Never used  ?Substance Use Topics  ? Alcohol use: Not Currently  ?  Comment: not since confirmed pregnancy  ? Drug use: Yes  ?  Types: Other-see comments  ?  Comment: stopped methadone 3 weeks ago  ? ? ?Allergies:  ?Allergies   ?Allergen Reactions  ? Zoloft [Sertraline Hcl] Other (See Comments)  ?  Seizure  ? ? ?Medications Prior to Admission  ?Medication Sig Dispense Refill Last Dose  ? Accu-Chek Softclix Lancets lancets 1 each by Other route 4 (four) times daily. Use as instructed 100 each 12   ? Accu-Chek Softclix Lancets lancets 100 each by Other route 4 (four) times daily. 100 each 12   ? acetaminophen (TYLENOL) 325 MG tablet Take 2 tablets (650 mg total) by mouth every 4 (four) hours as needed (for pain scale < 4  OR  temperature  >/=  100.5 F).     ? aspirin EC 81 MG tablet Take 1 tablet (81 mg total) by mouth daily. Swallow whole. (Patient not taking: Reported on 01/29/2022) 60 tablet 4   ? Blood Glucose Monitoring Suppl (ACCU-CHEK GUIDE ME) w/Device KIT 1 kit by Does not apply route daily. 1 kit 0   ? Blood Glucose Monitoring Suppl (CONTOUR NEXT MONITOR) w/Device KIT 1 kit by Does not  apply route as needed. 1 kit 0   ? Blood Pressure Monitoring (BLOOD PRESSURE KIT) DEVI 1 kit by Does not apply route once a week. (Patient not taking: Reported on 02/11/2022) 1 each 0   ? docusate sodium (COLACE) 100 MG capsule Take 1 capsule (100 mg total) by mouth daily. (Patient not taking: Reported on 03/10/2022) 10 capsule 0   ? glucose blood (ACCU-CHEK GUIDE) test strip Use 4 times daily. Please check blood sugar first thing in the morning before breakfast fasting and 2 hours after every meal. (Patient not taking: Reported on 02/25/2022) 100 each 12   ? Misc. Devices (GOJJI WEIGHT SCALE) MISC 1 Device by Does not apply route every 30 (thirty) days. (Patient not taking: Reported on 02/11/2022) 1 each 0   ? nicotine (NICODERM CQ - DOSED IN MG/24 HOURS) 21 mg/24hr patch Place 1 patch (21 mg total) onto the skin daily. (Patient not taking: Reported on 02/11/2022) 28 patch 0   ? ondansetron (ZOFRAN-ODT) 4 MG disintegrating tablet Take 1 tablet (4 mg total) by mouth every 8 (eight) hours as needed for nausea or vomiting. (Patient not taking: Reported on  02/11/2022) 20 tablet 0   ? Prenatal Vit-Fe Fumarate-FA (PRENATAL PLUS VITAMIN/MINERAL) 27-1 MG TABS Take 1 tablet by mouth daily.     ? ? ?Review of Systems  ?Constitutional: Negative.  Negative for fatigue and fever.  ?HENT: Negative.    ?Respiratory: Negative.  Negative for shortness of breath.   ?Cardiovascular: Negative.  Negative for chest pain.  ?Gastrointestinal: Negative.  Negative for abdominal pain, constipation, diarrhea, nausea and vomiting.  ?Genitourinary: Negative.  Negative for dysuria.  ?Neurological:  Positive for headaches. Negative for dizziness.  ?Physical Exam  ? ?Blood pressure 138/77, pulse (!) 108, temperature 97.6 ?F (36.4 ?C), temperature source Oral, resp. rate 20, height $RemoveBe'5\' 3"'GBDHPFpzJ$  (1.6 m), weight 99.2 kg, last menstrual period 07/29/2021, unknown if currently breastfeeding. ? ?Patient Vitals for the past 24 hrs: ? BP Temp Temp src Pulse Resp SpO2 Height Weight  ?03/20/22 2331 (!) 118/59 -- -- (!) 101 -- -- -- --  ?03/20/22 2316 (!) 114/54 -- -- 99 -- -- -- --  ?03/20/22 2301 (!) 107/58 -- -- 99 -- -- -- --  ?03/20/22 2252 104/64 -- -- (!) 101 -- -- -- --  ?03/20/22 2201 119/73 -- -- (!) 101 -- -- -- --  ?03/20/22 2146 126/70 -- -- (!) 101 -- -- -- --  ?03/20/22 2131 121/75 -- -- (!) 102 -- 97 % -- --  ?03/20/22 2116 (!) 141/78 -- -- (!) 105 -- -- -- --  ?03/20/22 2113 128/81 -- -- (!) 107 -- -- -- --  ?03/20/22 2055 138/77 97.6 ?F (36.4 ?C) Oral (!) 108 20 -- $Rem'5\' 3"'XplX$  (1.6 m) 99.2 kg  ? ? ? ?Physical Exam ?Vitals and nursing note reviewed.  ?Constitutional:   ?   General: She is not in acute distress. ?   Appearance: She is well-developed.  ?HENT:  ?   Head: Normocephalic.  ?Eyes:  ?   Pupils: Pupils are equal, round, and reactive to light.  ?Cardiovascular:  ?   Rate and Rhythm: Normal rate and regular rhythm.  ?   Heart sounds: Normal heart sounds.  ?Pulmonary:  ?   Effort: Pulmonary effort is normal. No respiratory distress.  ?   Breath sounds: Normal breath sounds.  ?Abdominal:  ?    General: Bowel sounds are normal. There is no distension.  ?   Palpations: Abdomen is  soft.  ?   Tenderness: There is no abdominal tenderness.  ?Skin: ?   General: Skin is warm and dry.  ?Neurological:  ?   Mental Status: She is alert and oriented to person, place, and time.  ?Psychiatric:     ?   Mood and Affect: Mood normal.     ?   Behavior: Behavior normal.     ?   Thought Content: Thought content normal.     ?   Judgment: Judgment normal.  ? ?Fetal Tracing: ? ?Baseline: 125 ?Variability: moderate ?Accels: 15x15 ?Decels: none ? ?Toco: intermittent UCs ? ?MAU Course  ?Procedures ?Results for orders placed or performed during the hospital encounter of 03/20/22 (from the past 24 hour(s))  ?Protein / creatinine ratio, urine     Status: Abnormal  ? Collection Time: 03/20/22  9:07 PM  ?Result Value Ref Range  ? Creatinine, Urine 75.45 mg/dL  ? Total Protein, Urine 42 mg/dL  ? Protein Creatinine Ratio 0.56 (H) 0.00 - 0.15 mg/mg[Cre]  ?Urinalysis, Routine w reflex microscopic     Status: Abnormal  ? Collection Time: 03/20/22  9:07 PM  ?Result Value Ref Range  ? Color, Urine YELLOW YELLOW  ? APPearance HAZY (A) CLEAR  ? Specific Gravity, Urine 1.011 1.005 - 1.030  ? pH 6.0 5.0 - 8.0  ? Glucose, UA NEGATIVE NEGATIVE mg/dL  ? Hgb urine dipstick SMALL (A) NEGATIVE  ? Bilirubin Urine NEGATIVE NEGATIVE  ? Ketones, ur NEGATIVE NEGATIVE mg/dL  ? Protein, ur 30 (A) NEGATIVE mg/dL  ? Nitrite NEGATIVE NEGATIVE  ? Leukocytes,Ua TRACE (A) NEGATIVE  ? RBC / HPF 6-10 0 - 5 RBC/hpf  ? WBC, UA 11-20 0 - 5 WBC/hpf  ? Bacteria, UA MANY (A) NONE SEEN  ? Squamous Epithelial / LPF 6-10 0 - 5  ? Mucus PRESENT   ?CBC     Status: Abnormal  ? Collection Time: 03/20/22  9:19 PM  ?Result Value Ref Range  ? WBC 21.3 (H) 4.0 - 10.5 K/uL  ? RBC 3.54 (L) 3.87 - 5.11 MIL/uL  ? Hemoglobin 11.5 (L) 12.0 - 15.0 g/dL  ? HCT 33.5 (L) 36.0 - 46.0 %  ? MCV 94.6 80.0 - 100.0 fL  ? MCH 32.5 26.0 - 34.0 pg  ? MCHC 34.3 30.0 - 36.0 g/dL  ? RDW 14.6 11.5 - 15.5 %   ? Platelets 501 (H) 150 - 400 K/uL  ? nRBC 0.0 0.0 - 0.2 %  ?Comprehensive metabolic panel     Status: Abnormal  ? Collection Time: 03/20/22  9:19 PM  ?Result Value Ref Range  ? Sodium 136 135 - 145 mmol/L  ? Potassium

## 2022-03-25 ENCOUNTER — Encounter: Payer: Self-pay | Admitting: Obstetrics & Gynecology

## 2022-03-25 ENCOUNTER — Ambulatory Visit (INDEPENDENT_AMBULATORY_CARE_PROVIDER_SITE_OTHER): Payer: No Typology Code available for payment source | Admitting: Obstetrics & Gynecology

## 2022-03-25 VITALS — BP 129/84 | HR 108 | Wt 215.0 lb

## 2022-03-25 DIAGNOSIS — O24419 Gestational diabetes mellitus in pregnancy, unspecified control: Secondary | ICD-10-CM

## 2022-03-25 DIAGNOSIS — O0993 Supervision of high risk pregnancy, unspecified, third trimester: Secondary | ICD-10-CM

## 2022-03-25 DIAGNOSIS — O1493 Unspecified pre-eclampsia, third trimester: Secondary | ICD-10-CM

## 2022-03-25 MED ORDER — CYCLOBENZAPRINE HCL 5 MG PO TABS: 5.0000 mg | ORAL_TABLET | Freq: Three times a day (TID) | ORAL | 0 refills | Status: DC | PRN

## 2022-03-25 MED ORDER — PANTOPRAZOLE SODIUM 20 MG PO TBEC: 20.0000 mg | DELAYED_RELEASE_TABLET | Freq: Every day | ORAL | 1 refills | Status: DC

## 2022-03-25 NOTE — Progress Notes (Signed)
Pt in office for [place] visit. Pt c/o pain in the RUQ, dizziness, pelvic pain and pressure and back pain. Pt seen at MAU 03-20-22 for headache and high blood pressure.  ?

## 2022-03-25 NOTE — Progress Notes (Signed)
? ?  PRENATAL VISIT NOTE ? ?Subjective:  ?Cassandra Roth is a 41 y.o. G3P2002 at [redacted]w[redacted]d being seen today for ongoing prenatal care.  She is currently monitored for the following issues for this high-risk pregnancy and has Supervision of high-risk pregnancy; Advanced maternal age in multigravida; Leukocytosis; Nephrolithiasis; Cigarette nicotine dependence without complication; Gestational diabetes mellitus (GDM) affecting pregnancy, antepartum; Acute suppurative parotitis; [redacted] weeks gestation of pregnancy; and Preeclampsia, third trimester on their problem list. ? ?Patient reports backache, headache, heartburn, and occasional contractions.  Contractions: Irritability. Vag. Bleeding: None.  Movement: Present. Denies leaking of fluid.  ? ?The following portions of the patient's history were reviewed and updated as appropriate: allergies, current medications, past family history, past medical history, past social history, past surgical history and problem list.  ? ?Objective:  ? ?Vitals:  ? 03/25/22 1614  ?BP: 129/84  ?Pulse: (!) 108  ?Weight: 215 lb (97.5 kg)  ? ? ?Fetal Status: Fetal Heart Rate (bpm): 140   Movement: Present    ? ?General:  Alert, oriented and cooperative. Patient is in no acute distress.  ?Skin: Skin is warm and dry. No rash noted.   ?Cardiovascular: Normal heart rate noted  ?Respiratory: Normal respiratory effort, no problems with respiration noted  ?Abdomen: Soft, gravid, appropriate for gestational age.  Pain/Pressure: Present     ?Pelvic: Cervical exam deferred        ?Extremities: Normal range of motion.  Edema: Trace  ?Mental Status: Normal mood and affect. Normal behavior. Normal judgment and thought content.  ? ?Assessment and Plan:  ?Pregnancy: G3P2002 at [redacted]w[redacted]d ?1. Supervision of high risk pregnancy in third trimester ?Occasional brief h/a notes, back spasms and reflux ?- pantoprazole (PROTONIX) 20 MG tablet; Take 1 tablet (20 mg total) by mouth daily.  Dispense: 30 tablet; Refill: 1 ?-  cyclobenzaprine (FLEXERIL) 5 MG tablet; Take 1 tablet (5 mg total) by mouth every 8 (eight) hours as needed for muscle spasms.  Dispense: 20 tablet; Refill: 0 ?- [place] MFM FETAL BPP WO NON STRESS; Future ? ?2. Gestational diabetes mellitus (GDM) affecting pregnancy, antepartum ?Good diet control ? ?3. Preeclampsia, third trimester ?BP not elevated today but pr/cr .6, start in fetal surveillance ?Pre-e precautions ?Preterm labor symptoms and general obstetric precautions including but not limited to vaginal bleeding, contractions, leaking of fluid and fetal movement were reviewed in detail with the patient. ?Please refer to After Visit Summary for other counseling recommendations.  ? ?Return in about 1 week (around 04/01/2022). ? ?Future Appointments  ?Date Time Provider [place]  ?04/07/2022  3:15 PM [facility] NURSE [facility]  ?04/07/2022  3:30 PM [facility] US2 [facility] [place]  ? ? ?Emeterio Reeve, MD ? ?

## 2022-03-26 ENCOUNTER — Other Ambulatory Visit: Payer: Self-pay

## 2022-03-26 ENCOUNTER — Inpatient Hospital Stay (HOSPITAL_COMMUNITY): Payer: No Typology Code available for payment source

## 2022-03-26 ENCOUNTER — Encounter (HOSPITAL_COMMUNITY): Payer: Self-pay | Admitting: Family Medicine

## 2022-03-26 ENCOUNTER — Inpatient Hospital Stay (HOSPITAL_COMMUNITY)
Admission: AD | Admit: 2022-03-26 | Discharge: 2022-03-26 | Disposition: A | Discharge status: Home / Self Care | Payer: No Typology Code available for payment source | Attending: Family Medicine | Admitting: Family Medicine

## 2022-03-26 DIAGNOSIS — O0993 Supervision of high risk pregnancy, unspecified, third trimester: Secondary | ICD-10-CM

## 2022-03-26 DIAGNOSIS — Z3A34 34 weeks gestation of pregnancy: Secondary | ICD-10-CM | POA: Diagnosis not present

## 2022-03-26 DIAGNOSIS — R519 Headache, unspecified: Secondary | ICD-10-CM | POA: Insufficient documentation

## 2022-03-26 DIAGNOSIS — O09523 Supervision of elderly multigravida, third trimester: Secondary | ICD-10-CM | POA: Insufficient documentation

## 2022-03-26 DIAGNOSIS — Z87442 Personal history of urinary calculi: Secondary | ICD-10-CM | POA: Insufficient documentation

## 2022-03-26 DIAGNOSIS — N200 Calculus of kidney: Secondary | ICD-10-CM | POA: Diagnosis not present

## 2022-03-26 DIAGNOSIS — O26893 Other specified pregnancy related conditions, third trimester: Secondary | ICD-10-CM | POA: Diagnosis not present

## 2022-03-26 DIAGNOSIS — R1013 Epigastric pain: Secondary | ICD-10-CM | POA: Insufficient documentation

## 2022-03-26 DIAGNOSIS — O1493 Unspecified pre-eclampsia, third trimester: Secondary | ICD-10-CM | POA: Diagnosis present

## 2022-03-26 DIAGNOSIS — O26833 Pregnancy related renal disease, third trimester: Secondary | ICD-10-CM | POA: Diagnosis not present

## 2022-03-26 HISTORY — DX: Calculus of kidney: N20.0

## 2022-03-26 HISTORY — DX: Gestational (pregnancy-induced) hypertension without significant proteinuria, unspecified trimester: O13.9

## 2022-03-26 HISTORY — DX: Anemia, unspecified: D64.9

## 2022-03-26 HISTORY — DX: Gestational diabetes mellitus in pregnancy, unspecified control: O24.419

## 2022-03-26 LAB — URINALYSIS, ROUTINE W REFLEX MICROSCOPIC
Bilirubin Urine: NEGATIVE
Glucose, UA: NEGATIVE mg/dL
Ketones, ur: NEGATIVE mg/dL
Leukocytes,Ua: NEGATIVE
Nitrite: NEGATIVE
Protein, ur: 30 mg/dL — AB
Specific Gravity, Urine: 1.005 (ref 1.005–1.030)
pH: 7 (ref 5.0–8.0)

## 2022-03-26 LAB — COMPREHENSIVE METABOLIC PANEL
ALT: 20 U/L (ref 0–44)
AST: 10 U/L — ABNORMAL LOW (ref 15–41)
Albumin: 2.3 g/dL — ABNORMAL LOW (ref 3.5–5.0)
Alkaline Phosphatase: 146 U/L — ABNORMAL HIGH (ref 38–126)
Anion gap: 9 (ref 5–15)
BUN: 5 mg/dL — ABNORMAL LOW (ref 6–20)
CO2: 20 mmol/L — ABNORMAL LOW (ref 22–32)
Calcium: 9 mg/dL (ref 8.9–10.3)
Chloride: 109 mmol/L (ref 98–111)
Creatinine, Ser: 0.43 mg/dL — ABNORMAL LOW (ref 0.44–1.00)
GFR, Estimated: >60 mL/min (ref >60)
Glucose, Bld: 84 mg/dL (ref 70–99)
Potassium: 3.8 mmol/L (ref 3.5–5.1)
Sodium: 138 mmol/L (ref 135–145)
Total Bilirubin: 0.3 mg/dL (ref 0.3–1.2)
Total Protein: 6 g/dL — ABNORMAL LOW (ref 6.5–8.1)

## 2022-03-26 LAB — PROTEIN / CREATININE RATIO, URINE
Creatinine, Urine: 43.08 mg/dL
Protein Creatinine Ratio: 0.7 mg/mg{Cre} — ABNORMAL HIGH (ref 0.00–0.15)
Total Protein, Urine: 30 mg/dL

## 2022-03-26 LAB — CBC
HCT: 33.9 % — ABNORMAL LOW (ref 36.0–46.0)
Hemoglobin: 11.5 g/dL — ABNORMAL LOW (ref 12.0–15.0)
MCH: 32.3 pg (ref 26.0–34.0)
MCHC: 33.9 g/dL (ref 30.0–36.0)
MCV: 95.2 fL (ref 80.0–100.0)
Platelets: 457 10*3/uL — ABNORMAL HIGH (ref 150–400)
RBC: 3.56 MIL/uL — ABNORMAL LOW (ref 3.87–5.11)
RDW: 14.6 % (ref 11.5–15.5)
WBC: 18.8 10*3/uL — ABNORMAL HIGH (ref 4.0–10.5)
nRBC: 0 % (ref 0.0–0.2)

## 2022-03-26 LAB — LIPASE, BLOOD: Lipase: 27 U/L (ref 11–51)

## 2022-03-26 MED ORDER — CYCLOBENZAPRINE HCL 5 MG PO TABS
10.0000 mg | ORAL_TABLET | Freq: Once | ORAL | Status: AC
  Administered 2022-03-26: 10 mg via ORAL
  Filled 2022-03-26: qty 2

## 2022-03-26 MED ORDER — LACTATED RINGERS IV BOLUS
1000.0000 mL | Freq: Once | INTRAVENOUS | Status: AC
  Administered 2022-03-26: 1000 mL via INTRAVENOUS

## 2022-03-26 MED ORDER — METOCLOPRAMIDE HCL 5 MG/ML IJ SOLN
10.0000 mg | Freq: Once | INTRAMUSCULAR | Status: AC
  Administered 2022-03-26: 10 mg via INTRAVENOUS
  Filled 2022-03-26: qty 2

## 2022-03-26 MED ORDER — TRAMADOL HCL 50 MG PO TABS: 50.0000 mg | ORAL_TABLET | Freq: Four times a day (QID) | ORAL | 0 refills | Status: DC | PRN

## 2022-03-26 MED ORDER — CYCLOBENZAPRINE HCL 5 MG PO TABS: 5.0000 mg | ORAL_TABLET | Freq: Three times a day (TID) | ORAL | 0 refills | Status: DC | PRN

## 2022-03-26 MED ORDER — ONDANSETRON HCL 4 MG/2ML IJ SOLN
4.0000 mg | Freq: Once | INTRAMUSCULAR | Status: AC
  Administered 2022-03-26: 4 mg via INTRAVENOUS
  Filled 2022-03-26: qty 2

## 2022-03-26 MED ORDER — DIPHENHYDRAMINE HCL 50 MG/ML IJ SOLN
12.5000 mg | Freq: Once | INTRAMUSCULAR | Status: AC
  Administered 2022-03-26: 12.5 mg via INTRAVENOUS
  Filled 2022-03-26: qty 1

## 2022-03-26 MED ORDER — HYDROMORPHONE HCL 1 MG/ML IJ SOLN
1.0000 mg | Freq: Once | INTRAMUSCULAR | Status: AC
  Administered 2022-03-26: 1 mg via INTRAVENOUS
  Filled 2022-03-26: qty 1

## 2022-03-26 NOTE — MAU Note (Signed)
Pt back from u/s, does not need to have EFM reapplied per CNM ?

## 2022-03-26 NOTE — MAU Provider Note (Addendum)
?History  ? Cassandra Roth is a 41 y.o G3P2002 at [redacted]w[redacted]d with a history of preeclampsia, gestational DM, and nephrolithiasis who presents with elevated blood pressure, intermittent upper gastric pain, and left flank pain. She has been experiencing the pain for a few days, but it has gotten progressively worse in the past 24 hours, particularly at her left flank where the pain also shoots down her left hip. Her upper epigastric pain is intermittent and not as severe as her left-sided pain. She took Tylenol in the morning, but the pain still persists. Patient reports intermittent frontal headaches with trouble focusing her eyes at times. Her appetite is decreased, but she is trying to drink when she can with minimal food intake. Her urination and bowel movement frequency is regular. ? ?CSN: [id number] ? ?Arrival date and time: 03/26/22 1008 ? ? Event Date/Time  ? First Provider Initiated Contact with Patient 03/26/22 1102   ?  ? ?Chief Complaint  ?Patient presents with  ? Back Pain  ? Abdominal Pain  ? ?Back Pain ?Associated symptoms include abdominal pain.  ?Abdominal Pain ? ? ?OB History   ? ? Gravida  ?3  ? Para  ?2  ? Term  ?2  ? Preterm  ?   ? AB  ?   ? Living  ?2  ?  ? ? SAB  ?   ? IAB  ?   ? Ectopic  ?   ? Multiple  ?   ? Live Births  ?2  ?   ?  ?  ? ? ?Past Medical History:  ?Diagnosis Date  ? Anemia   ? Anxiety   ? Back pain   ? Gestational diabetes   ? with 3rd preg- diet controlled  ? Kidney stone   ? Migraine   ? Ovarian cyst   ? Pregnancy induced hypertension   ? Seizures ([place])   ? after taking zoloft  ? ? ?Past Surgical History:  ?Procedure Laterality Date  ? OVARIAN CYST REMOVAL    ? ? ?Family History  ?Problem Relation Age of Onset  ? Cancer Mother   ?     kidney  ? Hypertension Mother   ? Diabetes Mother   ? Kidney cancer Mother   ? Throat cancer Maternal Grandmother   ? Dementia Maternal Grandmother   ? ? ?Social History  ? ?Tobacco Use  ? Smoking status: Every Day  ?  Packs/day: 0.50  ?  Years: 26.00   ?  Pack years: 13.00  ?  Types: Cigarettes  ? Smokeless tobacco: Never  ?Vaping Use  ? Vaping Use: Never used  ?Substance Use Topics  ? Alcohol use: Not Currently  ?  Comment: not since confirmed pregnancy  ? Drug use: Not Currently  ?  Types: Other-see comments  ?  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 ? ?Allergies:  ?Allergies  ?Allergen Reactions  ? Zoloft [Sertraline Hcl] Other (See Comments)  ?  Seizure  ? ? ?Medications Prior to Admission  ?Medication Sig Dispense Refill Last Dose  ? acetaminophen (TYLENOL) 325 MG tablet Take 2 tablets (650 mg total) by mouth every 4 (four) hours as needed (for pain scale < 4  OR  temperature  >/=  100.5 F).   03/26/2022 at 0700  ? Prenatal Vit-Fe Fumarate-FA (PRENATAL PLUS VITAMIN/MINERAL) 27-1 MG TABS Take 1 tablet by mouth daily.   03/25/2022  ? Accu-Chek Softclix Lancets lancets 1 each by Other route 4 (four)  times daily. Use as instructed 100 each 12   ? Accu-Chek Softclix Lancets lancets 100 each by Other route 4 (four) times daily. 100 each 12   ? aspirin EC 81 MG tablet Take 1 tablet (81 mg total) by mouth daily. Swallow whole. (Patient not taking: Reported on 03/25/2022) 60 tablet 4   ? Blood Glucose Monitoring Suppl (ACCU-CHEK GUIDE ME) w/Device KIT 1 kit by Does not apply route daily. 1 kit 0   ? Blood Glucose Monitoring Suppl (CONTOUR NEXT MONITOR) w/Device KIT 1 kit by Does not apply route as needed. 1 kit 0   ? Blood Pressure Monitoring (BLOOD PRESSURE KIT) DEVI 1 kit by Does not apply route once a week. 1 each 0   ? cyclobenzaprine (FLEXERIL) 5 MG tablet Take 1 tablet (5 mg total) by mouth every 8 (eight) hours as needed for muscle spasms. 20 tablet 0   ? docusate sodium (COLACE) 100 MG capsule Take 1 capsule (100 mg total) by mouth daily. (Patient not taking: Reported on 03/10/2022) 10 capsule 0   ? glucose blood (ACCU-CHEK GUIDE) test strip Use 4 times daily. Please check blood sugar first thing in the morning before breakfast fasting and 2 hours  after every meal. (Patient not taking: Reported on 02/25/2022) 100 each 12   ? Misc. Devices (GOJJI WEIGHT SCALE) MISC 1 Device by Does not apply route every 30 (thirty) days. (Patient not taking: Reported on 03/25/2022) 1 each 0   ? nicotine (NICODERM CQ - DOSED IN MG/24 HOURS) 21 mg/24hr patch Place 1 patch (21 mg total) onto the skin daily. (Patient not taking: Reported on 02/11/2022) 28 patch 0   ? ondansetron (ZOFRAN-ODT) 4 MG disintegrating tablet Take 1 tablet (4 mg total) by mouth every 8 (eight) hours as needed for nausea or vomiting. (Patient not taking: Reported on 02/11/2022) 20 tablet 0   ? pantoprazole (PROTONIX) 20 MG tablet Take 1 tablet (20 mg total) by mouth daily. 30 tablet 1   ? ? ?Review of Systems  ?Gastrointestinal:  Positive for abdominal pain.  ?Musculoskeletal:  Positive for back pain.  ?Physical Exam  ? ?Blood pressure (!) 120/59, pulse 94, temperature 98 ?F (36.7 ?C), temperature source Oral, resp. rate 20, height $RemoveBe'5\' 3"'cNolydKjo$  (1.6 m), weight 97.9 kg, last menstrual period 07/29/2021, SpO2 98 %, unknown if currently breastfeeding. ? ?Physical Exam ?Constitutional:   ?   General: She is in acute distress.  ?   Appearance: She is ill-appearing.  ?HENT:  ?   Head: Normocephalic and atraumatic.  ?Cardiovascular:  ?   Rate and Rhythm: Normal rate and regular rhythm.  ?   Heart sounds: Normal heart sounds.  ?Pulmonary:  ?   Effort: Pulmonary effort is normal.  ?   Breath sounds: Normal breath sounds.  ?Abdominal:  ?   General: Bowel sounds are normal.  ?   Comments: Intermittent upper epigastric pain with no tenderness on palpation.  ?Musculoskeletal:  ?   Lumbar back: Tenderness present.  ?   Comments: Left flank tenderness on palpation  ?Neurological:  ?   Mental Status: She is alert.  ? ?FHR: Baseline 135, moderate variability, positive accelerations, no decelerations ? ?Contractions; none on Toco ? ?MAU Course  ?Procedures ? ?MDM ?Renal US ?Bolus LR ?Dilaudid ?Zofran ? ?Labs: CBC, CMP, PrCr ratio,  Lipase ? ?Assessment and Plan  ?Cassandra Lobe Malesky is a 41 y.o G3P2002 at [redacted]w[redacted]d with a history of preeclampsia, gestational DM, and nephrolithiasis who presents with elevated blood pressure,  intermittent upper gastric pain, and left flank pain. Her intense left-sided flank pain is concerning for kidney stones given her history of nephrolithiasis. Given her history of preeclampsia, she requires preeclampsia work-up - her blood pressure is stable at this time. ? ?Left-sided flank pain ?Renal US ?Dilauded 1 mg ? ?Dehydration ?Bolus LR ?Zofran ? ?Labs: CBC, CMP, PrCr ratio, Lipase ? ?Pirapat Rerkpattanapipat ?03/26/2022, 11:35 AM  ? ?CNM attestation: ? ?I have seen and examined this patient; I agree with above documentation in the medical student's note.  ? ?KOSISOCHUKWU GOLDBERG is a 41 y.o. [phone] reporting constant left flank/left low back pain, intermittent RUQ pain, dizziness, and headache. She took Tylenol this morning which did not help. ?+FM, denies LOF, VB, contractions, vaginal discharge. ? ?PE: ?BP (!) 120/59   Pulse 94   Temp 98 ?F (36.7 ?C) (Oral)   Resp 20   Ht $R'5\' 3"'kO$  (1.6 m)   Wt 97.9 kg   LMP 07/29/2021   SpO2 98%   BMI 38.23 kg/m?  ? ?Patient Vitals for the past 24 hrs: ? BP Temp Temp src Pulse Resp SpO2 Height Weight  ?03/26/22 1612 123/75 -- -- 93 -- -- -- --  ?03/26/22 1415 132/78 98.3 ?F (36.8 ?C) Oral 90 20 -- -- --  ?03/26/22 1115 (!) 120/59 -- -- 94 -- 98 % -- --  ?03/26/22 1100 132/75 -- -- (!) 102 -- 98 % -- --  ?03/26/22 1052 135/84 -- -- 98 -- -- -- --  ?03/26/22 1025 (!) 145/80 98 ?F (36.7 ?C) Oral 98 20 100 % -- --  ?03/26/22 1021 -- -- -- -- -- -- $Rem'5\' 3"'IMgF$  (1.6 m) 97.9 kg  ?  ?Gen: calm comfortable, NAD ?Resp: normal effort, no distress ?Abd: gravid ? ?ROS, labs, PMH reviewed ?NST reactive  ? ?Results for orders placed or performed during the hospital encounter of 03/26/22 (from the past 24 hour(s))  ?Urinalysis, Routine w reflex microscopic Urine, Clean Catch     Status: Abnormal  ? Collection  Time: 03/26/22 11:20 AM  ?Result Value Ref Range  ? Color, Urine YELLOW YELLOW  ? APPearance HAZY (A) CLEAR  ? Specific Gravity, Urine 1.005 1.005 - 1.030  ? pH 7.0 5.0 - 8.0  ? Glucose, UA NEGATIVE NEGATIVE mg/dL  ?

## 2022-03-26 NOTE — MAU Note (Signed)
Cassandra Roth is a 41 y.o. at [redacted]w[redacted]d here in MAU reporting: since yesterday has been dizzy and having intermittent RUQ pain. The main thing that brought her in is left sided back pain that wraps around to the front of her abdomen. No bleeding, LOF, or contractions. +FM. No urinary s/s. Hx of kidney stones.  ? ?Onset of complaint: yesterday ? ?Pain score: RUQ 6/10, left back 8/10 ? ?Vitals:  ? 03/26/22 1025  ?BP: (!) 145/80  ?Pulse: 98  ?Resp: 20  ?Temp: 98 ?F (36.7 ?C)  ?SpO2: 100%  ?   ?FHT:[id number] ? ?Lab orders placed from triage: UA ? ?

## 2022-03-26 NOTE — MAU Note (Signed)
RUQ pain started yesterday, comes and goes.  Dizziness started yesterday. Pain in left flank wraps around to front, started last night.  Tried to discuss RUQ pain during visit yesterday, "didn't really listen'.  ?

## 2022-04-02 ENCOUNTER — Ambulatory Visit (INDEPENDENT_AMBULATORY_CARE_PROVIDER_SITE_OTHER): Payer: No Typology Code available for payment source | Admitting: Obstetrics and Gynecology

## 2022-04-02 ENCOUNTER — Encounter: Payer: Self-pay | Admitting: Obstetrics and Gynecology

## 2022-04-02 VITALS — BP 127/75 | HR 102 | Wt 217.7 lb

## 2022-04-02 DIAGNOSIS — O24419 Gestational diabetes mellitus in pregnancy, unspecified control: Secondary | ICD-10-CM

## 2022-04-02 DIAGNOSIS — O0993 Supervision of high risk pregnancy, unspecified, third trimester: Secondary | ICD-10-CM

## 2022-04-02 DIAGNOSIS — O09523 Supervision of elderly multigravida, third trimester: Secondary | ICD-10-CM

## 2022-04-02 NOTE — Progress Notes (Signed)
Pt in office for [place] visit. She does not have any concerns at this time.  ?

## 2022-04-02 NOTE — Patient Instructions (Signed)

## 2022-04-02 NOTE — Progress Notes (Signed)
Subjective:  ?Cassandra Roth is a 41 y.o. G3P2002 at [redacted]w[redacted]d being seen today for ongoing prenatal care.  She is currently monitored for the following issues for this high-risk pregnancy and has Supervision of high-risk pregnancy; Advanced maternal age in multigravida; Nephrolithiasis; Cigarette nicotine dependence without complication; Gestational diabetes mellitus (GDM) affecting pregnancy, antepartum; Acute suppurative parotitis; and Preeclampsia, third trimester on their problem list. ? ?Patient reports general discomforts of pregnancy.  Contractions: Irritability. Vag. Bleeding: None.  Movement: Present. Denies leaking of fluid.  ? ?The following portions of the patient's history were reviewed and updated as appropriate: allergies, current medications, past family history, past medical history, past social history, past surgical history and problem list. Problem list updated. ? ?Objective:  ? ?Vitals:  ? 04/02/22 1448  ?BP: 127/75  ?Pulse: (!) 102  ?Weight: 217 lb 11.2 oz (98.7 kg)  ? ? ?Fetal Status: Fetal Heart Rate (bpm): 135   Movement: Present    ? ?General:  Alert, oriented and cooperative. Patient is in no acute distress.  ?Skin: Skin is warm and dry. No rash noted.   ?Cardiovascular: Normal heart rate noted  ?Respiratory: Normal respiratory effort, no problems with respiration noted  ?Abdomen: Soft, gravid, appropriate for gestational age. Pain/Pressure: Present     ?Pelvic:  Cervical exam deferred        ?Extremities: Normal range of motion.  Edema: Trace  ?Mental Status: Normal mood and affect. Normal behavior. Normal judgment and thought content.  ? ?Urinalysis:     ? ?Assessment and Plan:  ?Pregnancy: [id number] at [redacted]w[redacted]d ? ?1. Supervision of high risk pregnancy in third trimester ?Stable ?GBS and vaginal cultures next visit ? ?2. Gestational diabetes mellitus (GDM) affecting pregnancy, antepartum ?CBG's in goal range ? ? ?3. Multigravida of advanced maternal age in third trimester ?Stable ?Starting  antenatal testing and serial growth scans next week ? ?Preterm labor symptoms and general obstetric precautions including but not limited to vaginal bleeding, contractions, leaking of fluid and fetal movement were reviewed in detail with the patient. ?Please refer to After Visit Summary for other counseling recommendations.  ?Return in about 1 week (around 04/09/2022) for OB visit, face to face, MD only. ? ? ?Chancy Milroy, MD ?

## 2022-04-07 ENCOUNTER — Ambulatory Visit: Payer: No Typology Code available for payment source | Attending: Obstetrics and Gynecology

## 2022-04-07 ENCOUNTER — Ambulatory Visit: Payer: No Typology Code available for payment source | Admitting: *Deleted

## 2022-04-07 VITALS — BP 134/74 | HR 110

## 2022-04-07 DIAGNOSIS — O99333 Smoking (tobacco) complicating pregnancy, third trimester: Secondary | ICD-10-CM

## 2022-04-07 DIAGNOSIS — O0993 Supervision of high risk pregnancy, unspecified, third trimester: Secondary | ICD-10-CM

## 2022-04-07 DIAGNOSIS — O1490 Unspecified pre-eclampsia, unspecified trimester: Secondary | ICD-10-CM

## 2022-04-07 DIAGNOSIS — O24419 Gestational diabetes mellitus in pregnancy, unspecified control: Secondary | ICD-10-CM | POA: Insufficient documentation

## 2022-04-07 DIAGNOSIS — O358XX0 Maternal care for other (suspected) fetal abnormality and damage, not applicable or unspecified: Secondary | ICD-10-CM

## 2022-04-07 DIAGNOSIS — O24410 Gestational diabetes mellitus in pregnancy, diet controlled: Secondary | ICD-10-CM

## 2022-04-07 DIAGNOSIS — O09523 Supervision of elderly multigravida, third trimester: Secondary | ICD-10-CM | POA: Diagnosis not present

## 2022-04-07 DIAGNOSIS — Z3A36 36 weeks gestation of pregnancy: Secondary | ICD-10-CM

## 2022-04-07 DIAGNOSIS — F17200 Nicotine dependence, unspecified, uncomplicated: Secondary | ICD-10-CM

## 2022-04-09 ENCOUNTER — Encounter: Payer: Self-pay | Admitting: Obstetrics

## 2022-04-09 ENCOUNTER — Ambulatory Visit (INDEPENDENT_AMBULATORY_CARE_PROVIDER_SITE_OTHER): Payer: No Typology Code available for payment source | Admitting: Obstetrics

## 2022-04-09 ENCOUNTER — Other Ambulatory Visit: Payer: Self-pay | Admitting: Advanced Practice Midwife

## 2022-04-09 ENCOUNTER — Other Ambulatory Visit (HOSPITAL_COMMUNITY)
Admission: RE | Admit: 2022-04-09 | Discharge: 2022-04-09 | Disposition: A | Discharge status: Home / Self Care | Payer: No Typology Code available for payment source | Source: Ambulatory Visit | Attending: Obstetrics | Admitting: Obstetrics

## 2022-04-09 VITALS — BP 128/81 | HR 109 | Wt 217.8 lb

## 2022-04-09 DIAGNOSIS — O0993 Supervision of high risk pregnancy, unspecified, third trimester: Secondary | ICD-10-CM | POA: Insufficient documentation

## 2022-04-09 DIAGNOSIS — O09523 Supervision of elderly multigravida, third trimester: Secondary | ICD-10-CM

## 2022-04-09 DIAGNOSIS — O24419 Gestational diabetes mellitus in pregnancy, unspecified control: Secondary | ICD-10-CM

## 2022-04-09 DIAGNOSIS — O1493 Unspecified pre-eclampsia, third trimester: Secondary | ICD-10-CM

## 2022-04-09 LAB — OB RESULTS CONSOLE GC/CHLAMYDIA: Gonorrhea: NEGATIVE

## 2022-04-09 NOTE — Progress Notes (Signed)
MFM Note ? ?I reviewed the patient's chart with Dr. Jodi Mourning.   ? ?The patient's pregnancy is complicated by diet-controlled gestational diabetes and probable preeclampsia without severe features based on her elevated P/C ratio.   ? ?Due to gestational diabetes and mild preeclampsia, delivery is recommended at 37 weeks.   ? ?Dr. Jodi Mourning will schedule an induction for the patient at around 37 weeks. ?

## 2022-04-09 NOTE — Progress Notes (Signed)
Pt in office for ROB visit. Pt has concerns about inductions due to hypertension. No other concerns at this time.  ?

## 2022-04-09 NOTE — Progress Notes (Signed)
Subjective:  ?Cassandra Roth is a 41 y.o. G3P2002 at [redacted]w[redacted]d being seen today for ongoing prenatal care.  She is currently monitored for the following issues for this high-risk pregnancy and has Supervision of high-risk pregnancy; Advanced maternal age in multigravida; Nephrolithiasis; Cigarette nicotine dependence without complication; Gestational diabetes mellitus (GDM) affecting pregnancy, antepartum; Acute suppurative parotitis; and Preeclampsia, third trimester on their problem list. ? ?Patient reports  none .  Contractions: Irritability. Vag. Bleeding: None.  Movement: Present. Denies leaking of fluid.  ? ?The following portions of the patient's history were reviewed and updated as appropriate: allergies, current medications, past family history, past medical history, past social history, past surgical history and problem list. Problem list updated. ? ?Objective:  ? ?Vitals:  ? 04/09/22 1621  ?BP: 128/81  ?Pulse: (!) 109  ?Weight: 217 lb 12.8 oz (98.8 kg)  ? ? ?Fetal Status: Fetal Heart Rate (bpm): 145   Movement: Present    ? ?General:  Alert, oriented and cooperative. Patient is in no acute distress.  ?Skin: Skin is warm and dry. No rash noted.   ?Cardiovascular: Normal heart rate noted  ?Respiratory: Normal respiratory effort, no problems with respiration noted  ?Abdomen: Soft, gravid, appropriate for gestational age. Pain/Pressure: Present     ?Pelvic:  Cervical exam deferred        ?Extremities: Normal range of motion.  Edema: Trace  ?Mental Status: Normal mood and affect. Normal behavior. Normal judgment and thought content.  ? ?Urinalysis:     ? ?Assessment and Plan:  ?Pregnancy: [id number] at [redacted]w[redacted]d ? ?1. Supervision of high risk pregnancy in third trimester ?Rx: ?- Culture, beta strep (group b only) ?- Cervicovaginal ancillary only( [place]) ? ?2. Multigravida of advanced maternal age in third trimester ? ?3. Gestational diabetes mellitus (GDM) affecting pregnancy, antepartum ?- glucose well controlled:   FBS's < 90 and 2 hour PP's < 120 ? ?4. Preeclampsia, third trimester without severe features ?- BP's well controlled ?- IOL at 37 weeks, per MFM ? ?  ?Preterm labor symptoms and general obstetric precautions including but not limited to vaginal bleeding, contractions, leaking of fluid and fetal movement were reviewed in detail with the patient. ?Please refer to After Visit Summary for other counseling recommendations.  ? ?Return in about 6 weeks (around 05/21/2022) for postpartum visit. ? ? ?Shelly Bombard, MD  ?04/09/22  ?

## 2022-04-10 ENCOUNTER — Encounter (HOSPITAL_COMMUNITY): Payer: Self-pay | Admitting: *Deleted

## 2022-04-10 ENCOUNTER — Telehealth (HOSPITAL_COMMUNITY): Payer: Self-pay | Admitting: *Deleted

## 2022-04-10 NOTE — Telephone Encounter (Signed)
Preadmission screen  

## 2022-04-11 LAB — CERVICOVAGINAL ANCILLARY ONLY
Bacterial Vaginitis (gardnerella): NEGATIVE
Candida Glabrata: NEGATIVE
Candida Vaginitis: NEGATIVE
Chlamydia: NEGATIVE
Comment: NEGATIVE
Comment: NEGATIVE
Comment: NEGATIVE
Comment: NEGATIVE
Comment: NEGATIVE
Comment: NORMAL
Neisseria Gonorrhea: NEGATIVE
Trichomonas: NEGATIVE

## 2022-04-13 LAB — CULTURE, BETA STREP (GROUP B ONLY): Strep Gp B Culture: NEGATIVE

## 2022-04-15 ENCOUNTER — Inpatient Hospital Stay (HOSPITAL_COMMUNITY): Payer: No Typology Code available for payment source | Admitting: Anesthesiology

## 2022-04-15 ENCOUNTER — Other Ambulatory Visit: Payer: Self-pay

## 2022-04-15 ENCOUNTER — Inpatient Hospital Stay (HOSPITAL_COMMUNITY): Payer: No Typology Code available for payment source

## 2022-04-15 ENCOUNTER — Inpatient Hospital Stay (HOSPITAL_COMMUNITY)
Admission: AD | Admit: 2022-04-15 | Discharge: 2022-04-18 | DRG: 807 | Disposition: A | Discharge status: Home / Self Care | Payer: No Typology Code available for payment source | Attending: Family Medicine | Admitting: Family Medicine

## 2022-04-15 ENCOUNTER — Encounter (HOSPITAL_COMMUNITY): Payer: Self-pay | Admitting: Family Medicine

## 2022-04-15 ENCOUNTER — Encounter: Payer: No Typology Code available for payment source | Admitting: Obstetrics and Gynecology

## 2022-04-15 DIAGNOSIS — F17210 Nicotine dependence, cigarettes, uncomplicated: Secondary | ICD-10-CM | POA: Diagnosis present

## 2022-04-15 DIAGNOSIS — O24419 Gestational diabetes mellitus in pregnancy, unspecified control: Secondary | ICD-10-CM

## 2022-04-15 DIAGNOSIS — O1404 Mild to moderate pre-eclampsia, complicating childbirth: Secondary | ICD-10-CM | POA: Diagnosis present

## 2022-04-15 DIAGNOSIS — O24420 Gestational diabetes mellitus in childbirth, diet controlled: Secondary | ICD-10-CM | POA: Diagnosis present

## 2022-04-15 DIAGNOSIS — Z8759 Personal history of other complications of pregnancy, childbirth and the puerperium: Secondary | ICD-10-CM | POA: Diagnosis present

## 2022-04-15 DIAGNOSIS — Z3A37 37 weeks gestation of pregnancy: Secondary | ICD-10-CM | POA: Diagnosis not present

## 2022-04-15 DIAGNOSIS — O1493 Unspecified pre-eclampsia, third trimester: Secondary | ICD-10-CM | POA: Diagnosis present

## 2022-04-15 DIAGNOSIS — O1400 Mild to moderate pre-eclampsia, unspecified trimester: Secondary | ICD-10-CM | POA: Diagnosis present

## 2022-04-15 DIAGNOSIS — O0990 Supervision of high risk pregnancy, unspecified, unspecified trimester: Secondary | ICD-10-CM

## 2022-04-15 DIAGNOSIS — N200 Calculus of kidney: Secondary | ICD-10-CM

## 2022-04-15 DIAGNOSIS — Z8632 Personal history of gestational diabetes: Secondary | ICD-10-CM | POA: Diagnosis present

## 2022-04-15 DIAGNOSIS — O0993 Supervision of high risk pregnancy, unspecified, third trimester: Principal | ICD-10-CM

## 2022-04-15 DIAGNOSIS — O99334 Smoking (tobacco) complicating childbirth: Secondary | ICD-10-CM | POA: Diagnosis present

## 2022-04-15 DIAGNOSIS — O09529 Supervision of elderly multigravida, unspecified trimester: Secondary | ICD-10-CM

## 2022-04-15 LAB — COMPREHENSIVE METABOLIC PANEL
ALT: 17 U/L (ref 0–44)
AST: 16 U/L (ref 15–41)
Albumin: 2.2 g/dL — ABNORMAL LOW (ref 3.5–5.0)
Alkaline Phosphatase: 184 U/L — ABNORMAL HIGH (ref 38–126)
Anion gap: 12 (ref 5–15)
BUN: 6 mg/dL (ref 6–20)
CO2: 18 mmol/L — ABNORMAL LOW (ref 22–32)
Calcium: 9.5 mg/dL (ref 8.9–10.3)
Chloride: 107 mmol/L (ref 98–111)
Creatinine, Ser: 0.45 mg/dL (ref 0.44–1.00)
GFR, Estimated: >60 mL/min (ref >60)
Glucose, Bld: 95 mg/dL (ref 70–99)
Potassium: 4.3 mmol/L (ref 3.5–5.1)
Sodium: 137 mmol/L (ref 135–145)
Total Bilirubin: 0.3 mg/dL (ref 0.3–1.2)
Total Protein: 5.5 g/dL — ABNORMAL LOW (ref 6.5–8.1)

## 2022-04-15 LAB — CBC
HCT: 34.9 % — ABNORMAL LOW (ref 36.0–46.0)
HCT: 34.8 % — ABNORMAL LOW (ref 36.0–46.0)
Hemoglobin: 11.8 g/dL — ABNORMAL LOW (ref 12.0–15.0)
Hemoglobin: 11.6 g/dL — ABNORMAL LOW (ref 12.0–15.0)
MCH: 32 pg (ref 26.0–34.0)
MCH: 31.8 pg (ref 26.0–34.0)
MCHC: 33.8 g/dL (ref 30.0–36.0)
MCHC: 33.3 g/dL (ref 30.0–36.0)
MCV: 94.6 fL (ref 80.0–100.0)
MCV: 95.3 fL (ref 80.0–100.0)
Platelets: 458 10*3/uL — ABNORMAL HIGH (ref 150–400)
Platelets: 483 10*3/uL — ABNORMAL HIGH (ref 150–400)
RBC: 3.69 MIL/uL — ABNORMAL LOW (ref 3.87–5.11)
RBC: 3.65 MIL/uL — ABNORMAL LOW (ref 3.87–5.11)
RDW: 14.7 % (ref 11.5–15.5)
RDW: 14.9 % (ref 11.5–15.5)
WBC: 17 10*3/uL — ABNORMAL HIGH (ref 4.0–10.5)
WBC: 17 10*3/uL — ABNORMAL HIGH (ref 4.0–10.5)
nRBC: 0 % (ref 0.0–0.2)
nRBC: 0 % (ref 0.0–0.2)

## 2022-04-15 LAB — GLUCOSE, CAPILLARY
Glucose-Capillary: 99 mg/dL (ref 70–99)
Glucose-Capillary: 97 mg/dL (ref 70–99)
Glucose-Capillary: 114 mg/dL — ABNORMAL HIGH (ref 70–99)
Glucose-Capillary: 97 mg/dL (ref 70–99)

## 2022-04-15 LAB — TYPE AND SCREEN
ABO/RH(D): A NEG
Antibody Screen: POSITIVE

## 2022-04-15 LAB — RPR: RPR Ser Ql: NONREACTIVE

## 2022-04-15 MED ORDER — PHENYLEPHRINE 80 MCG/ML (10ML) SYRINGE FOR IV PUSH (FOR BLOOD PRESSURE SUPPORT)
80.0000 ug | PREFILLED_SYRINGE | INTRAVENOUS | Status: DC | PRN
  Filled 2022-04-15: qty 10

## 2022-04-15 MED ORDER — SOD CITRATE-CITRIC ACID 500-334 MG/5ML PO SOLN: 30.0000 mL | ORAL | Status: DC | PRN

## 2022-04-15 MED ORDER — LOPERAMIDE HCL 2 MG PO CAPS
2.0000 mg | ORAL_CAPSULE | ORAL | Status: DC | PRN
  Administered 2022-04-15: 2 mg via ORAL
  Filled 2022-04-15: qty 1

## 2022-04-15 MED ORDER — ONDANSETRON HCL 4 MG/2ML IJ SOLN
4.0000 mg | Freq: Four times a day (QID) | INTRAMUSCULAR | Status: DC | PRN
  Administered 2022-04-16: 4 mg via INTRAVENOUS
  Filled 2022-04-15: qty 2

## 2022-04-15 MED ORDER — LACTATED RINGERS IV SOLN: INTRAVENOUS | Status: DC

## 2022-04-15 MED ORDER — OXYTOCIN-SODIUM CHLORIDE 30-0.9 UT/500ML-% IV SOLN: 2.5000 [IU]/h | INTRAVENOUS | Status: DC

## 2022-04-15 MED ORDER — EPHEDRINE 5 MG/ML INJ: 10.0000 mg | INTRAVENOUS | Status: DC | PRN

## 2022-04-15 MED ORDER — LIDOCAINE HCL (PF) 1 % IJ SOLN
INTRAMUSCULAR | Status: DC | PRN
Start: 2022-04-15 — End: 2022-04-16
  Administered 2022-04-15: 8 mL via EPIDURAL

## 2022-04-15 MED ORDER — OXYCODONE-ACETAMINOPHEN 5-325 MG PO TABS: 1.0000 | ORAL_TABLET | ORAL | Status: DC | PRN

## 2022-04-15 MED ORDER — FENTANYL-BUPIVACAINE-NACL 0.5-0.125-0.9 MG/250ML-% EP SOLN
12.0000 mL/h | EPIDURAL | Status: DC | PRN
  Administered 2022-04-15: 12 mL/h via EPIDURAL
  Filled 2022-04-15 (×2): qty 250

## 2022-04-15 MED ORDER — FENTANYL CITRATE (PF) 100 MCG/2ML IJ SOLN
100.0000 ug | INTRAMUSCULAR | Status: DC | PRN
  Administered 2022-04-15 (×4): 100 ug via INTRAVENOUS
  Filled 2022-04-15 (×4): qty 2

## 2022-04-15 MED ORDER — MISOPROSTOL 50MCG HALF TABLET
50.0000 ug | ORAL_TABLET | ORAL | Status: DC | PRN
  Administered 2022-04-15: 50 ug via BUCCAL
  Filled 2022-04-15: qty 1

## 2022-04-15 MED ORDER — DIPHENHYDRAMINE HCL 50 MG/ML IJ SOLN: 12.5000 mg | INTRAMUSCULAR | Status: DC | PRN

## 2022-04-15 MED ORDER — ACETAMINOPHEN 325 MG PO TABS: 650.0000 mg | ORAL_TABLET | ORAL | Status: DC | PRN

## 2022-04-15 MED ORDER — LACTATED RINGERS IV SOLN: 500.0000 mL | Freq: Once | INTRAVENOUS | Status: DC

## 2022-04-15 MED ORDER — OXYTOCIN BOLUS FROM INFUSION
333.0000 mL | Freq: Once | INTRAVENOUS | Status: AC
  Administered 2022-04-16: 333 mL via INTRAVENOUS

## 2022-04-15 MED ORDER — LIDOCAINE HCL (PF) 1 % IJ SOLN: 30.0000 mL | INTRAMUSCULAR | Status: DC | PRN

## 2022-04-15 MED ORDER — OXYCODONE-ACETAMINOPHEN 5-325 MG PO TABS: 2.0000 | ORAL_TABLET | ORAL | Status: DC | PRN

## 2022-04-15 MED ORDER — TERBUTALINE SULFATE 1 MG/ML IJ SOLN: 0.2500 mg | Freq: Once | INTRAMUSCULAR | Status: DC | PRN

## 2022-04-15 MED ORDER — MISOPROSTOL 25 MCG QUARTER TABLET: 25.0000 ug | ORAL_TABLET | ORAL | Status: DC | PRN

## 2022-04-15 MED ORDER — LACTATED RINGERS IV SOLN: 500.0000 mL | INTRAVENOUS | Status: DC | PRN

## 2022-04-15 MED ORDER — OXYTOCIN-SODIUM CHLORIDE 30-0.9 UT/500ML-% IV SOLN: 1.0000 m[IU]/min | INTRAVENOUS | Status: DC

## 2022-04-15 MED ORDER — PHENYLEPHRINE 80 MCG/ML (10ML) SYRINGE FOR IV PUSH (FOR BLOOD PRESSURE SUPPORT): 80.0000 ug | PREFILLED_SYRINGE | INTRAVENOUS | Status: DC | PRN

## 2022-04-15 MED ORDER — OXYTOCIN-SODIUM CHLORIDE 30-0.9 UT/500ML-% IV SOLN
1.0000 m[IU]/min | INTRAVENOUS | Status: DC
  Administered 2022-04-15: 2 m[IU]/min via INTRAVENOUS
  Filled 2022-04-15: qty 500

## 2022-04-15 NOTE — Progress Notes (Signed)
Labor Progress Note ?Cassandra Roth is a 41 y.o. G3P2002 at [redacted]w[redacted]d who presented for IOL due to pre-eclampsia without severe features.  ? ?S: Doing well. Feeling painful contractions and coping well. No concerns.  ? ?O:  ?BP 107/69 (BP Location: Left Arm)   Pulse (!) 102   Temp 97.7 ?F (36.5 ?C) (Oral)   Resp 20   Ht 5\' 3"  (1.6 m)   Wt 100 kg   LMP 07/29/2021   BMI 39.04 kg/m?  ? ?EFM: Baseline 135 bpm, moderate variability, + accels, no decels  ?Toco: Every 1-3 minutes  ? ?CVE: Dilation: 1 ?Effacement (%): Thick ?Station: Ballotable ?Presentation: Vertex ?Exam by:: Dr. [email or web address] ? ?A&P: 41 y.o. G3P2002 [redacted]w[redacted]d  ? ?#Labor: Progressing well. Foley balloon placed this check without difficulty. Contracting well every 1-3 minutes, will hold on additional dose of Cytotec for now and reassess in 4 hours.  ?#Pain: IV Fentanyl for now; planning for epidural when ready  ?#FWB: Cat 1  ?#GBS negative ? ?#A1GDM: CBGs remain within normal limits. Will continue to check every 4 hours in latent phase. ? ?#Pre-eclampsia: BP normal range. No symptoms. Labs stable. Will continue to monitor.  ? ?[redacted]w[redacted]d, MD ?1:02 PM ? ?

## 2022-04-15 NOTE — Progress Notes (Signed)
Patient ID: Cassandra Roth, female   DOB: 27-Aug-1981, 41 y.o.   MRN: [id number] ?Still hurting some, epidural bolused ? ?. ?Vitals:  ? 04/15/22 2001 04/15/22 2030 04/15/22 2100 04/15/22 2131  ?BP: 95/76 121/76 (!) 149/83 (!) 138/93  ?Pulse: 97 90 87 99  ?Resp: 17  17   ?Temp:      ?TempSrc:      ?SpO2:      ?Weight:      ?Height:      ? ?FHR stable ?UCs irregular ? ?Dilation: 5 ?Effacement (%): 70 ?Cervical Position: Middle ?Station: -3 ?Presentation: Vertex ?Exam by:: Mayford Knife CNM ? ?Cervix is still a little posterior ?Head applied to cervix but not low ? ?AROM with some difficulty, scant fluid ? ?IUPC inserted ?

## 2022-04-15 NOTE — H&P (Signed)
OBSTETRIC ADMISSION HISTORY AND PHYSICAL ? ?RONNAE KASER is a 41 y.o. female [phone] with IUP at [redacted]w[redacted]d by LMP presenting for IOL due to pre-eclampsia without severe features and A1GDM. She reports +FMs, no LOF, no VB, no blurry vision, headaches, peripheral edema, or RUQ pain.  She plans on formula feeding. She is planning to use Depo as a bridge to partner vasectomy for birth control postpartum.  ? ?She received her prenatal care at [facility]. ? ?Dating: By LMP --->  Estimated Date of Delivery: 05/05/22 ? ?Sono:   ?$'@[redacted]w[redacted]d'N$ , CWD, normal anatomy, cephalic presentation, anterior placental lie, 3259 g, 89% EFW ? ?Prenatal History/Complications:  ?Pre-eclampsia without severe features  ?A1GDM (EFW 89%) ?AMA (LR NIPS, Horizon neg, AFP neg)  ?Nephrolithiasis in pregnancy  ?Acute parotitis in pregnancy  ? ?Past Medical History: ?Past Medical History:  ?Diagnosis Date  ? Anemia   ? Anxiety   ? Back pain   ? Gestational diabetes   ? with 3rd preg- diet controlled  ? Kidney stone   ? Migraine   ? Ovarian cyst   ? Pregnancy induced hypertension   ? Seizures ([place])   ? after taking zoloft  ? ? ?Past Surgical History: ?Past Surgical History:  ?Procedure Laterality Date  ? OVARIAN CYST REMOVAL    ? ? ?Obstetrical History: ?OB History   ? ? Gravida  ?3  ? Para  ?2  ? Term  ?2  ? Preterm  ?   ? AB  ?   ? Living  ?2  ?  ? ? SAB  ?   ? IAB  ?   ? Ectopic  ?   ? Multiple  ?   ? Live Births  ?2  ?   ?  ?  ? ? ?Social History ?Social History  ? ?Socioeconomic History  ? Marital status: Married  ?  Spouse name: Nicole Kindred  ? Number of children: Not on file  ? Years of education: Not on file  ? Highest education level: Not on file  ?Occupational History  ? Not on file  ?Tobacco Use  ? Smoking status: Every Day  ?  Packs/day: 0.50  ?  Years: 26.00  ?  Pack years: 13.00  ?  Types: Cigarettes  ? Smokeless tobacco: Never  ?Vaping Use  ? Vaping Use: Never used  ?Substance and Sexual Activity  ? Alcohol use: Not Currently  ?  Comment: not since  confirmed pregnancy  ? Drug use: Not Currently  ?  Types: Other-see comments  ?  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 Sexual activity: Yes  ?  Partners: Male  ?  Birth control/protection: None  ?Other Topics Concern  ? Not on file  ?Social History Narrative  ? Not on file  ? ?Social Determinants of Health  ? ?Financial Resource Strain: Not on file  ?Food Insecurity: Not on file  ?Transportation Needs: Not on file  ?Physical Activity: Not on file  ?Stress: Not on file  ?Social Connections: Not on file  ? ? ?Family History: ?Family History  ?Problem Relation Age of Onset  ? Cancer Mother   ?     kidney  ? Hypertension Mother   ? Diabetes Mother   ? Kidney cancer Mother   ? Throat cancer Maternal Grandmother   ? Dementia Maternal Grandmother   ? ? ?Allergies: ?Allergies  ?Allergen Reactions  ? Zoloft [Sertraline Hcl] Other (See Comments)  ?  Seizure  ? ? ?Medications Prior  to Admission  ?Medication Sig Dispense Refill Last Dose  ? Accu-Chek Softclix Lancets lancets 1 each by Other route 4 (four) times daily. Use as instructed 100 each 12   ? Accu-Chek Softclix Lancets lancets 100 each by Other route 4 (four) times daily. 100 each 12   ? acetaminophen (TYLENOL) 325 MG tablet Take 2 tablets (650 mg total) by mouth every 4 (four) hours as needed (for pain scale < 4  OR  temperature  >/=  100.5 F).     ? aspirin EC 81 MG tablet Take 1 tablet (81 mg total) by mouth daily. Swallow whole. (Patient not taking: Reported on 03/25/2022) 60 tablet 4   ? Blood Glucose Monitoring Suppl (ACCU-CHEK GUIDE ME) w/Device KIT 1 kit by Does not apply route daily. 1 kit 0   ? Blood Glucose Monitoring Suppl (CONTOUR NEXT MONITOR) w/Device KIT 1 kit by Does not apply route as needed. 1 kit 0   ? Blood Pressure Monitoring (BLOOD PRESSURE KIT) DEVI 1 kit by Does not apply route once a week. 1 each 0   ? glucose blood (ACCU-CHEK GUIDE) test strip Use 4 times daily. Please check blood sugar first thing in the morning before  breakfast fasting and 2 hours after every meal. 100 each 12   ? Misc. Devices (GOJJI WEIGHT SCALE) MISC 1 Device by Does not apply route every 30 (thirty) days. (Patient not taking: Reported on 03/25/2022) 1 each 0   ? Prenatal Vit-Fe Fumarate-FA (PRENATAL PLUS VITAMIN/MINERAL) 27-1 MG TABS Take 1 tablet by mouth daily.     ? ? ? ?Review of Systems  ?All systems reviewed and negative except as stated in HPI ? ?Blood pressure 133/82, pulse 100, temperature 98.3 ?F (36.8 ?C), temperature source Oral, resp. rate 18, height $RemoveBe'5\' 3"'RbkaQylBG$  (1.6 m), weight 100 kg, last menstrual period 07/29/2021, unknown if currently breastfeeding. ? ?General appearance: alert, cooperative, and no distress ?Lungs: normal work of breathing on room air  ?Heart: normal rate, warm and well perfused  ?Abdomen: soft, non-tender, gravid  ?Extremities: no LE edema or calf tenderness to palpation  ? ?Presentation:  Cephalic per RN ?Fetal monitoring: Baseline 135 bpm, moderate variability, + accels, no decels  ?Uterine activity: Irregular contractions  ?Dilation: Closed ?Effacement (%): Thick ?Station: -3 ?Exam by:: August Albino RNC ? ? ?Prenatal labs: ?ABO, Rh: --/--/A NEG (04/25 0725) ?Antibody: PENDING (04/25 0725) ?Rubella: 1.31 (11/29 1113) ?RPR: Non Reactive (02/24 0858)  ?HBsAg: Negative (11/29 1113)  ?HIV: Non Reactive (02/24 0858)  ?GBS: Negative/-- (04/19 1646)  ?2 hr Glucola - Abnormal  ?Genetic screening - LR NIPS, Horizon negative, AFP negative  ?Anatomy US - Mild right UTD, otherwise normal  ? ?Prenatal Transfer Tool  ?Maternal Diabetes: Yes:  Diabetes Type:  Diet controlled ?Genetic Screening: Normal ?Maternal Ultrasounds/Referrals: Mild right UTD, otherwise normal  ?Fetal Ultrasounds or other Referrals:  None ?Maternal Substance Abuse:  Tobacco use ?Significant Maternal Medications:  None ?Significant Maternal Lab Results: Group B Strep negative ? ?Results for orders placed or performed during the hospital encounter of 04/15/22 (from the  past 24 hour(s))  ?Type and screen  ? Collection Time: 04/15/22  7:25 AM  ?Result Value Ref Range  ? ABO/RH(D) A NEG   ? Antibody Screen PENDING   ? Sample Expiration    ?  04/18/2022,2359 ?Performed at [facility], [place] [address], [place], [place] [address] ?  ?Glucose, capillary  ? Collection Time: 04/15/22  7:32 AM  ?Result Value Ref Range  ?  Glucose-Capillary 99 70 - 99 mg/dL  ? Comment 1 Notify RN   ? Comment 2 Document in Chart   ?CBC  ? Collection Time: 04/15/22  8:15 AM  ?Result Value Ref Range  ? WBC 17.0 (H) 4.0 - 10.5 K/uL  ? RBC 3.69 (L) 3.87 - 5.11 MIL/uL  ? Hemoglobin 11.8 (L) 12.0 - 15.0 g/dL  ? HCT 34.9 (L) 36.0 - 46.0 %  ? MCV 94.6 80.0 - 100.0 fL  ? MCH 32.0 26.0 - 34.0 pg  ? MCHC 33.8 30.0 - 36.0 g/dL  ? RDW 14.7 11.5 - 15.5 %  ? Platelets 458 (H) 150 - 400 K/uL  ? nRBC 0.0 0.0 - 0.2 %  ? ? ?Patient Active Problem List  ? Diagnosis Date Noted  ? Pre-eclampsia, mild 04/15/2022  ? Preeclampsia, third trimester 03/11/2022  ? Acute suppurative parotitis 02/21/2022  ? Gestational diabetes mellitus (GDM) affecting pregnancy, antepartum 02/17/2022  ? Advanced maternal age in multigravida 11/19/2021  ? Supervision of high-risk pregnancy 11/18/2021  ? Nephrolithiasis 11/29/2018  ? Cigarette nicotine dependence without complication 15/03/1363  ? ? ?Assessment/Plan:  ?DEASIAH HAGBERG is a 41 y.o. G3P2002 at [redacted]w[redacted]d here for IOL due to pre-eclampsia without severe features and A1GDM. ? ?#Labor: Cervix closed on admission, Cytotec given. Will reassess in 4 hours and plan to place foley balloon when able.  ?#Pain: PRN, planning for epidural  ?#FWB: Cat 1  ?#ID:  GBS neg ?#MOF: Formula  ?#MOC: Depo > partner vasectomy  ?#Circ:  Desires inpatient  ? ?#Pre-eclampsia without severe features: BP normal to mild range. No symptoms. Admit CBC within normal limits. CMP pending. Will continue to monitor.  ? ?#A1GDM: EFW 89%, 3259 g. Hx of two prior uncomplicated vaginal deliveries. Initial CBG at goal (99). Plan for  q4hr glucose checks in latent phase.  ? ?Genia Del, MD  ?04/15/2022, 9:33 AM ? ? ? ?

## 2022-04-15 NOTE — Anesthesia Procedure Notes (Signed)
Epidural ?Patient location during procedure: OB ?Start time: 04/15/2022 5:10 PM ?End time: 04/15/2022 5:20 PM ? ?Staffing ?Anesthesiologist: Mellody Dance, MD ?Performed: anesthesiologist  ? ?Preanesthetic Checklist ?Completed: patient identified, IV checked, site marked, risks and benefits discussed, monitors and equipment checked, pre-op evaluation and timeout performed ? ?Epidural ?Patient position: sitting ?Prep: DuraPrep ?Patient monitoring: heart rate, cardiac monitor, continuous pulse ox and blood pressure ?Approach: midline ?Location: L2-L3 ?Injection technique: LOR saline ? ?Needle:  ?Needle type: Tuohy  ?Needle gauge: 17 G ?Needle length: 9 cm ?Needle insertion depth: 5 cm ?Catheter type: closed end flexible ?Catheter size: 20 Guage ?Catheter at skin depth: 10 cm ?Test dose: negative and Other ? ?Assessment ?Events: blood not aspirated, injection not painful, no injection resistance and negative IV test ? ?Additional Notes ?Informed consent obtained prior to proceeding including risk of failure, 1% risk of PDPH, risk of minor discomfort and bruising.  Discussed rare but serious complications including epidural abscess, permanent nerve injury, epidural hematoma.  Discussed alternatives to epidural analgesia and patient desires to proceed.  Timeout performed pre-procedure verifying patient name, procedure, and platelet count.  Patient tolerated procedure well. ? ? ? ? ?

## 2022-04-15 NOTE — Anesthesia Preprocedure Evaluation (Signed)
Anesthesia Evaluation  ?Patient identified by MRN, date of birth, ID band ?Patient awake ? ? ? ?Reviewed: ?Allergy & Precautions, NPO status , Patient's Chart, lab work & pertinent test results ? ?Airway ?Mallampati: II ? ?TM Distance: >3 FB ?Neck ROM: Full ? ? ? Dental ?no notable dental hx. ? ?  ?Pulmonary ?neg pulmonary ROS, Current Smoker,  ?  ?Pulmonary exam normal ?breath sounds clear to auscultation ? ? ? ? ? ? Cardiovascular ?hypertension, Normal cardiovascular exam ?Rhythm:Regular Rate:Normal ? ? ?  ?Neuro/Psych ? Headaches, PSYCHIATRIC DISORDERS Anxiety   ? GI/Hepatic ?negative GI ROS, Neg liver ROS,   ?Endo/Other  ?diabetes, Gestational ? Renal/GU ?negative Renal ROS  ?negative genitourinary ?  ?Musculoskeletal ?negative musculoskeletal ROS ?(+)  ? Abdominal ?  ?Peds ?negative pediatric ROS ?(+)  Hematology ? ?(+) Blood dyscrasia, anemia ,   ?Anesthesia Other Findings ? ? Reproductive/Obstetrics ?(+) Pregnancy ? ?  ? ? ? ? ? ? ? ? ? ? ? ? ? ?  ?  ? ? ? ? ? ? ? ? ?Anesthesia Physical ?Anesthesia Plan ? ?ASA: 3 ? ?Anesthesia Plan: Epidural  ? ?Post-op Pain Management:   ? ?Induction:  ? ?PONV Risk Score and Plan: Treatment may vary due to age or medical condition ? ?Airway Management Planned: Natural Airway ? ?Additional Equipment:  ? ?Intra-op Plan:  ? ?Post-operative Plan:  ? ?Informed Consent: I have reviewed the patients History and Physical, chart, labs and discussed the procedure including the risks, benefits and alternatives for the proposed anesthesia with the patient or authorized representative who has indicated his/her understanding and acceptance.  ? ? ? ? ? ?Plan Discussed with:  ? ?Anesthesia Plan Comments:   ? ? ? ? ? ? ?Anesthesia Quick Evaluation ? ?

## 2022-04-16 ENCOUNTER — Encounter (HOSPITAL_COMMUNITY): Payer: Self-pay | Admitting: Family Medicine

## 2022-04-16 DIAGNOSIS — O1404 Mild to moderate pre-eclampsia, complicating childbirth: Secondary | ICD-10-CM

## 2022-04-16 DIAGNOSIS — Z3A37 37 weeks gestation of pregnancy: Secondary | ICD-10-CM

## 2022-04-16 DIAGNOSIS — O24420 Gestational diabetes mellitus in childbirth, diet controlled: Secondary | ICD-10-CM

## 2022-04-16 LAB — GLUCOSE, CAPILLARY
Glucose-Capillary: 109 mg/dL — ABNORMAL HIGH (ref 70–99)
Glucose-Capillary: 78 mg/dL (ref 70–99)

## 2022-04-16 MED ORDER — DIBUCAINE (PERIANAL) 1 % EX OINT: 1.0000 "application " | TOPICAL_OINTMENT | CUTANEOUS | Status: DC | PRN

## 2022-04-16 MED ORDER — SIMETHICONE 80 MG PO CHEW
80.0000 mg | CHEWABLE_TABLET | ORAL | Status: DC | PRN
  Administered 2022-04-17: 80 mg via ORAL
  Filled 2022-04-16: qty 1

## 2022-04-16 MED ORDER — TETANUS-DIPHTH-ACELL PERTUSSIS 5-2.5-18.5 LF-MCG/0.5 IM SUSY: 0.5000 mL | PREFILLED_SYRINGE | Freq: Once | INTRAMUSCULAR | Status: DC

## 2022-04-16 MED ORDER — CYCLOBENZAPRINE HCL 10 MG PO TABS
5.0000 mg | ORAL_TABLET | Freq: Once | ORAL | Status: AC
  Administered 2022-04-16: 5 mg via ORAL
  Filled 2022-04-16: qty 1

## 2022-04-16 MED ORDER — MEDROXYPROGESTERONE ACETATE 150 MG/ML IM SUSP: 150.0000 mg | INTRAMUSCULAR | Status: DC | PRN

## 2022-04-16 MED ORDER — COCONUT OIL OIL: 1.0000 "application " | TOPICAL_OIL | Status: DC | PRN

## 2022-04-16 MED ORDER — ACETAMINOPHEN 325 MG PO TABS
650.0000 mg | ORAL_TABLET | ORAL | Status: DC | PRN
  Administered 2022-04-16 – 2022-04-18 (×3): 650 mg via ORAL
  Filled 2022-04-16 (×3): qty 2

## 2022-04-16 MED ORDER — WITCH HAZEL-GLYCERIN EX PADS
1.0000 | MEDICATED_PAD | CUTANEOUS | Status: DC | PRN
Start: 2022-04-16 — End: 2022-04-18

## 2022-04-16 MED ORDER — IBUPROFEN 600 MG PO TABS
600.0000 mg | ORAL_TABLET | Freq: Four times a day (QID) | ORAL | Status: DC
  Administered 2022-04-16 – 2022-04-18 (×9): 600 mg via ORAL
  Filled 2022-04-16 (×9): qty 1

## 2022-04-16 MED ORDER — BENZOCAINE-MENTHOL 20-0.5 % EX AERO
1.0000 "application " | INHALATION_SPRAY | CUTANEOUS | Status: DC | PRN
  Administered 2022-04-16 – 2022-04-18 (×2): 1 via TOPICAL
  Filled 2022-04-16 (×2): qty 56

## 2022-04-16 MED ORDER — ONDANSETRON HCL 4 MG PO TABS: 4.0000 mg | ORAL_TABLET | ORAL | Status: DC | PRN

## 2022-04-16 MED ORDER — SENNOSIDES-DOCUSATE SODIUM 8.6-50 MG PO TABS
2.0000 | ORAL_TABLET | Freq: Every day | ORAL | Status: DC
  Administered 2022-04-17 – 2022-04-18 (×2): 2 via ORAL
  Filled 2022-04-16 (×2): qty 2

## 2022-04-16 MED ORDER — OXYCODONE HCL 5 MG PO TABS
10.0000 mg | ORAL_TABLET | ORAL | Status: DC | PRN
  Administered 2022-04-17 – 2022-04-18 (×7): 10 mg via ORAL
  Filled 2022-04-16 (×7): qty 2

## 2022-04-16 MED ORDER — OXYCODONE HCL 5 MG PO TABS
5.0000 mg | ORAL_TABLET | ORAL | Status: DC | PRN
  Administered 2022-04-16 – 2022-04-17 (×4): 5 mg via ORAL
  Filled 2022-04-16 (×4): qty 1

## 2022-04-16 MED ORDER — DIPHENHYDRAMINE HCL 25 MG PO CAPS: 25.0000 mg | ORAL_CAPSULE | Freq: Four times a day (QID) | ORAL | Status: DC | PRN

## 2022-04-16 MED ORDER — FUROSEMIDE 20 MG PO TABS
20.0000 mg | ORAL_TABLET | Freq: Every day | ORAL | Status: DC
Start: 2022-04-17 — End: 2022-04-18
  Administered 2022-04-17 – 2022-04-18 (×2): 20 mg via ORAL
  Filled 2022-04-16 (×2): qty 1

## 2022-04-16 MED ORDER — ONDANSETRON HCL 4 MG/2ML IJ SOLN: 4.0000 mg | INTRAMUSCULAR | Status: DC | PRN

## 2022-04-16 MED ORDER — PRENATAL MULTIVITAMIN CH
1.0000 | ORAL_TABLET | Freq: Every day | ORAL | Status: DC
  Administered 2022-04-16 – 2022-04-18 (×3): 1 via ORAL
  Filled 2022-04-16 (×3): qty 1

## 2022-04-16 NOTE — Progress Notes (Signed)
Pt complaining of sharp/radiating pain in left lower abdominal region. PT was given 5mg  of Oxy around 1830, only improved pain level slightly. RN notified MD and was given new orders to administer Flexeril. RN will continue to monitor closely. ? ? , RN ?

## 2022-04-16 NOTE — Progress Notes (Signed)
Patient ID: Cassandra Roth, female   DOB: February 04, 1981, 41 y.o.   MRN: [id number] ?Doing well, resting ? ?Vitals:  ? 04/16/22 0509 04/16/22 0530 04/16/22 0600 04/16/22 0630  ?BP:  (!) 102/52 (!) 93/36 (!) 110/56  ?Pulse:  100 (!) 101 95  ?Resp:   16   ?Temp: 97.9 ?F (36.6 ?C)     ?TempSrc: Oral     ?SpO2:      ?Weight:      ?Height:      ? ?FHR reassuring ?UCs q42min ? ?Dilation: 6.5 ?Effacement (%): 70 ?Cervical Position: Middle ?Station: -2, -3 ?Presentation: Vertex ?Exam by:: Mickle Mallory, RN ? ?

## 2022-04-16 NOTE — Discharge Summary (Signed)
? ?  Postpartum Discharge Summary ? ?Date of Service updated ? ?   ?Patient Name: Cassandra Roth ?DOB: 07/28/1981 ?MRN: [id number] ? ?Date of admission: 04/15/2022 ?Delivery date:04/16/2022  ?Delivering provider: Genia Del  ?Date of discharge: 04/18/2022 ? ?Admitting diagnosis: Pre-eclampsia, mild [O14.00] ?Intrauterine pregnancy: [redacted]w[redacted]d     ?Secondary diagnosis:  Principal Problem: ?  Vaginal delivery ?Active Problems: ?  Supervision of high-risk pregnancy ?  Advanced maternal age in multigravida ?  Cigarette nicotine dependence without complication ?  Gestational diabetes mellitus (GDM) affecting pregnancy, antepartum ?  Pre-eclampsia, mild ? ?Additional problems: None    ?Discharge diagnosis: Term Pregnancy Delivered                                              ?Post partum procedures: None ?Augmentation: AROM, Pitocin, Cytotec, and IP Foley ?Complications: None ? ?Hospital course: Induction of Labor With Vaginal Delivery   ?41 y.o. yo G3P2002 at [redacted]w[redacted]d was admitted to the hospital 04/15/2022 for induction of labor.  Indication for induction: Preeclampsia.  Patient had an uncomplicated labor course and progressed to complete after Cytotec, foley balloon placement, Pitocin, and AROM. She had an uncomplicated vaginal delivery.  ?Membrane Rupture Time/Date: 10:00 PM ,04/15/2022   ?Delivery Method:Vaginal, Spontaneous  ?Episiotomy: None  ?Lacerations:  1st degree;Perineal  ?Details of delivery can be found in separate delivery note.  Patient had a routine postpartum course.  Her BP postpartum was WNL.  She was started on Lasix 20 mg and will continue this upon discharge to complete a 5 day course.  She is eating, drinking, voiding, and ambulating without issue.  Her pain and bleeding are controlled.  She is formula feeding well.  Patient is discharged home 04/18/22. ? ?Newborn Data: ?Birth date:04/16/2022  ?Birth time:8:59 AM  ?Gender:Female  ?Living status:Living  ?Apgars:8 ,9  ?Weight:3110 g  ? ?Magnesium Sulfate  received: No ?BMZ received: No ?Rhophylac: No Infant A negative  ?MMR: N/A ?T-DaP: Offered postpartum  ?Flu: No ?Transfusion: No ? ?Physical exam  ?Vitals:  ? 04/17/22 0550 04/17/22 1444 04/17/22 2238 04/18/22 0503  ?BP: 109/77 116/69 121/69 120/83  ?Pulse: 90 88 88 80  ?Resp: $Remov'17 16 18 18  'WNzAid$ ?Temp: 98.2 ?F (36.8 ?C) 98.6 ?F (37 ?C) 98.8 ?F (37.1 ?C) 97.9 ?F (36.6 ?C)  ?TempSrc: Other (Comment) Oral Oral Oral  ?SpO2: 98% 100% 99% 98%  ?Weight:      ?Height:      ? ?General: alert, cooperative, and no distress ?Lochia: appropriate ?Uterine Fundus: firm ?Abdomen: Soft, non-distended, appropriately tender without rebounding or guarding  ?Incision: N/A ?DVT Evaluation: mild 1+ pitting edema to mid shin  ? ?Labs: ?Lab Results  ?Component Value Date  ? WBC 17.0 (H) 04/15/2022  ? HGB 11.6 (L) 04/15/2022  ? HCT 34.8 (L) 04/15/2022  ? MCV 95.3 04/15/2022  ? PLT 483 (H) 04/15/2022  ? ? ?  Latest Ref Rng & Units 04/15/2022  ?  7:25 AM  ?CMP  ?Glucose 70 - 99 mg/dL 95    ?BUN 6 - 20 mg/dL 6    ?Creatinine 0.44 - 1.00 mg/dL 0.45    ?Sodium 135 - 145 mmol/L 137    ?Potassium 3.5 - 5.1 mmol/L 4.3    ?Chloride 98 - 111 mmol/L 107    ?CO2 22 - 32 mmol/L 18    ?Calcium 8.9 - 10.3 mg/dL  9.5    ?Total Protein 6.5 - 8.1 g/dL 5.5    ?Total Bilirubin 0.3 - 1.2 mg/dL 0.3    ?Alkaline Phos 38 - 126 U/L 184    ?AST 15 - 41 U/L 16    ?ALT 0 - 44 U/L 17    ? ?Edinburgh Score: ? ?  04/16/2022  ? 12:15 PM  ?Flavia Shipper Postnatal Depression Scale Screening Tool  ?I have been able to laugh and see the funny side of things. 0  ?I have looked forward with enjoyment to things. 0  ?I have blamed myself unnecessarily when things went wrong. 1  ?I have been anxious or worried for no good reason. 1  ?I have felt scared or panicky for no good reason. 0  ?Things have been getting on top of me. 0  ?I have been so unhappy that I have had difficulty sleeping. 0  ?I have felt sad or miserable. 1  ?I have been so unhappy that I have been crying. 1  ?The thought of  harming myself has occurred to me. 0  ?Edinburgh Postnatal Depression Scale Total 4  ? ? ? ?After visit meds:  ?Allergies as of 04/18/2022   ? ?   Reactions  ? Zoloft [sertraline Hcl] Other (See Comments)  ? Seizure  ? ?  ? ?  ?Medication List  ?  ? ?STOP taking these medications   ? ?aspirin EC 81 MG tablet ?  ? ?  ? ?TAKE these medications   ? ?Accu-Chek Guide test strip ?Generic drug: glucose blood ?Use 4 times daily. Please check blood sugar first thing in the morning before breakfast fasting and 2 hours after every meal. ?  ?Accu-Chek Softclix Lancets lancets ?1 each by Other route 4 (four) times daily. Use as instructed ?  ?Accu-Chek Softclix Lancets lancets ?100 each by Other route 4 (four) times daily. ?  ?acetaminophen 325 MG tablet ?Commonly known as: TYLENOL ?Take 2 tablets (650 mg total) by mouth every 4 (four) hours as needed (for pain scale < 4  OR  temperature  >/=  100.5 F). ?  ?Blood Pressure Kit Devi ?1 kit by Does not apply route once a week. ?  ?Contour Next Monitor w/Device Kit ?1 kit by Does not apply route as needed. ?  ?Accu-Chek Guide Me w/Device Kit ?1 kit by Does not apply route daily. ?  ?furosemide 20 MG tablet ?Commonly known as: LASIX ?Take 1 tablet (20 mg total) by mouth daily. ?  ?Gojji Weight Scale Misc ?1 Device by Does not apply route every 30 (thirty) days. ?  ?ibuprofen 600 MG tablet ?Commonly known as: ADVIL ?Take 1 tablet (600 mg total) by mouth every 6 (six) hours as needed. ?  ?polyethylene glycol 17 g packet ?Commonly known as: MIRALAX / GLYCOLAX ?Take 17 g by mouth daily. ?  ?Prenatal Plus Vitamin/Mineral 27-1 MG Tabs ?Take 1 tablet by mouth daily. ?  ?simethicone 80 MG chewable tablet ?Commonly known as: MYLICON ?Chew 1 tablet (80 mg total) by mouth as needed for flatulence. ?  ? ?  ? ? ? ?Discharge home in stable condition ?Infant Feeding: Bottle ?Infant Disposition:home with mother ?Discharge instruction: per After Visit Summary and Postpartum booklet. ?Activity: Advance  as tolerated. Pelvic rest for 6 weeks.  ?Diet: routine diet ?Future Appointments: ?Future Appointments  ?Date Time Provider [place]  ?04/23/2022  1:40 PM [facility] NURSE [facility] None  ?05/28/2022  9:15 AM [facility] None  ?05/28/2022 10:15 AM Emeterio Reeve  G, MD [place] None  ? ?Follow up Visit: ?Message sent to [facility] by Dr. Gwenlyn Perking on 04/16/22.  ? ?Please schedule this patient for a In person postpartum visit in 6 weeks with the following provider: Any provider. ?Additional Postpartum F/U: 2 hour GTT and BP check 1 week  ?High risk pregnancy complicated by: GDM and pre-eclampsia ?Delivery mode:  Vaginal, Spontaneous  ?Anticipated Birth Control:  Depo bridge to partner vasectomy  ? ?04/18/2022 ?Patriciaann Clan, DO ? ? ? ?

## 2022-04-16 NOTE — Progress Notes (Signed)
MOB was referred for history of anxiety. ?* Referral screened out by Clinical Social Worker because none of the following criteria appear to apply: ?~ History of anxiety/depression during this pregnancy, or of post-partum depression following prior delivery. No concerns noted in prenatal records.  ?~ Diagnosis of anxiety and/or depression within last 3 years. Per chart review, MOB's anxiety dates back to 2012.  ?OR ?* MOB's symptoms currently being treated with medication and/or therapy. ?Please contact the Clinical Social Worker if needs arise, by [facility] request, or if MOB scores greater than 9/yes to question 10 on Edinburgh Postpartum Depression Screen. ? ?Abundio Miu, LCSW ?Clinical Social Worker ?[facility] ?Cell#: [phone] ?

## 2022-04-17 LAB — BIRTH TISSUE RECOVERY COLLECTION (PLACENTA DONATION)

## 2022-04-17 MED ORDER — POLYETHYLENE GLYCOL 3350 17 G PO PACK
17.0000 g | PACK | Freq: Every day | ORAL | Status: DC
  Administered 2022-04-17 – 2022-04-18 (×2): 17 g via ORAL
  Filled 2022-04-17 (×2): qty 1

## 2022-04-17 NOTE — Anesthesia Postprocedure Evaluation (Signed)
Anesthesia Post Note ? ?Patient: Cassandra Roth ? ?Procedure(s) Performed: AN AD HOC LABOR EPIDURAL ? ?  ? ?Patient location during evaluation: Mother Baby ?Anesthesia Type: Epidural ?Level of consciousness: awake, oriented and awake and alert ?Pain management: pain level controlled ?Vital Signs Assessment: post-procedure vital signs reviewed and stable ?Respiratory status: spontaneous breathing, respiratory function stable and nonlabored ventilation ?Cardiovascular status: stable ?Postop Assessment: no headache, adequate PO intake, able to ambulate, patient able to bend at knees and no apparent nausea or vomiting ?Anesthetic complications: no ? ? ?No notable events documented. ? ?Last Vitals:  ?Vitals:  ? 04/17/22 0225 04/17/22 0550  ?BP: 113/67 109/77  ?Pulse: 89 90  ?Resp: 17 17  ?Temp: 36.7 ?C 36.8 ?C  ?SpO2: 98% 98%  ?  ?Last Pain:  ?Vitals:  ? 04/17/22 0830  ?TempSrc:   ?PainSc: 6   ? ?Pain Goal:   ? ?  ?  ?  ?  ?  ?  ?  ? ?Fayette Gasner ? ? ? ? ?

## 2022-04-17 NOTE — Progress Notes (Signed)
Post Partum Day 1 ?Subjective: ?voiding, tolerating PO, and Patient semi-fowlers position. She is concern about sharp left ovary pain and infant female kidney on ultrasound during prenatal.  ? ?Objective: ?Blood pressure 109/77, pulse 90, temperature 98.2 ?F (36.8 ?C), temperature source Other (Comment), resp. rate 17, height 5\' 3"  (1.6 m), weight 100 kg, last menstrual period 07/29/2021, SpO2 98 %, unknown if currently breastfeeding. ? ?Physical Exam:  ?General: alert and cooperative ?Lochia: appropriate ?Uterine Fundus: firm ?Incision: N/A ?DVT Evaluation: No evidence of DVT seen on physical exam. ? ?Recent Labs  ?  04/15/22 ?[id number] 04/15/22 ?1620  ?HGB 11.8* 11.6*  ?HCT 34.9* 34.8*  ? ? ?Assessment/Plan: ?Plan for discharge tomorrow, Circumcision prior to discharge, and Contraception Depo ? ? LOS: 2 days  ? ?[place], SNM ?04/17/2022, 7:46 AM  ? ? ?

## 2022-04-17 NOTE — Clinical Social Work Maternal (Signed)
?CLINICAL SOCIAL WORK MATERNAL/CHILD NOTE ? ?Patient Details  ?Name: Cassandra Roth ?MRN: [id number] ?Date of Birth: Apr 19, 1981 ? ?Date:  04/17/2022 ? ?Clinical Social Worker Initiating Note:  Kathrin Greathouse, LCSW Date/Time: Initiated:  04/17/22/1344    ? ?Child's Name:  Yazmeen Woolf  ? ?Biological Parents:  Mother, Father (MOb: Mertie Haslem [birth year] 1982, FOB: Clovis Riley. 05-20-1981)  ? ?Need for Interpreter:  None  ? ?Reason for Referral:  Other (Comment) (Previous drug dependence)  ? ?Address:  [place] [address]  ?  ?Phone number:  [phone] (home)    ? ?Additional phone number:  ? ?Household Members/Support Persons (HM/SP):   Household Member/Support Person 1, Household Member/Support Person 2 ? ? ?HM/SP Name Relationship DOB or Age  ?HM/SP -1 Clovis Riley. Spouse 05-20-1981  ?HM/SP -2 Ligia Duguay Daughter 07-08-2007  ?HM/SP -3        ?HM/SP -4        ?HM/SP -5        ?HM/SP -6        ?HM/SP -7        ?HM/SP -8        ? ? ?Natural Supports (not living in the home):  Immediate Family  ? 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 ?Employment: Full-time  ? ?Type of Work: [employer]  ? ?Education:  Other (comment) (Associates Degree)  ? ?Homebound arranged:   ? ?Financial Resources:  Multimedia programmer   ? ?Other Resources:  WIC  ? ?Cultural/Religious Considerations Which May Impact Care:   ? ?Strengths:  Ability to meet basic needs  , Home prepared for child  , Pediatrician chosen  ? ?Psychotropic Medications:        ? ?Pediatrician:    [place] area ? ?Pediatrician List:  ? ?[facility] ([place])  ?[place]    ?[facility]    ?[facility]    ?[facility]    ?[facility]    ? ? ?Pediatrician Fax Number:   ? ?Risk Factors/Current Problems:  Substance Use    ? ?Cognitive State:  Able to Concentrate  , Insightful  , Alert  , Linear Thinking     ? ?Mood/Affect:  Comfortable  , Interested  , Calm  , Tearful    ? ? ?CSW Assessment: CSW received consult for Previous drug dependence. CSW met with MOB to offer support and complete assessment.   ? ?CSW met with MOB at bedside and introduced CSW role. CSW observed MOB in bed, the infant asleep in bassinet and FOB present at bedside. MOB presented calm and receptive to [place] visit. CSW inquired how MOB has felt since giving birth. MOB reported feeling good. MOB confirmed that she lives with her spouse and daughter. MOB identified her spouse, daughters, mom, and sister as supports. MOB reported that she works for [employer] and receives [employer]. MOB reported that she has all essential items for the infant including a bassinet where the infant will sleep.  ? ?CSW inquired about MOB substance use/Methadone use during the pregnancy. MOB reported that she quit taking Methadone after learning about the pregnancy in October/November 2022. MOB reported that she was prescribed Methadone through the [facility] and prior to the pregnancy had weaned herself from 30m to 343mof Methadone. MOB denied any other use substance use. CSW informed MOB about the hospital drug screen policy. MOB made aware that CSW will monitor  the infant's CDS and make a notification to CPS for in utero substance exposure for methadone. MOB informed that if infant is positive for any substance not provided by prescription, then a report will be filed with CPS. MOB reported that during the pregnancy she was hospitalized for kidney stones and given IV Dilaudid and a 5 day supply of oxycodone for pain control. MOB reported that she was also given Fentanyl during the labor process. MOB reported that she does not like to be given pain medications due to her past addiction with pain pills. CSW encouraged MOB to advocate for herself and ask for alternative pain control if she desires. CSW inquired about CPS history. MOB reported that her  daughter had an open case in 2020 after she attempted suicide. MOB presented very tearful. CSW inquired about MOB emotions. MOB expressed that it was a tough time for her and family. FOB politely interjected and tearfully encouraged MOB not to blame herself but to blame him. FOB emphasized that ?they are a stronger family unit now.? CSW acknowledged MOB efforts and willingness to care for the infant and her family.  ? ?CSW inquired about mental health. MOB denied a history of mental health. MOB denied history of PPD. CSW provided education regarding the baby blues period vs. perinatal mood disorders, discussed treatment and gave resources for mental health follow up if concerns arise.  CSW recommended MOB complete a self-evaluation during the postpartum time period using the New Mom Checklist from Postpartum Progress and encouraged MOB to contact a medical professional if symptoms are noted at any time. CSW assessed MOB for safety. MOB denied thoughts of harm to self and others.  ? ?CSW provided review of Sudden Infant Death Syndrome (SIDS) precautions. MOB has chosen [facility] for the infant's follow up. CSW assessed MOB for additional need. MOB reported no further needs.  ? ?CSW identifies no further need for intervention and no barriers to discharge at this time.  ? ?Infant's UDS negative. CSW will continue to monitor the infant's CDS and make a report, if warranted.  ? ?CSW Plan/Description:  Sudden Infant Death Syndrome (SIDS) Education, CSW Will Continue to Monitor Umbilical Cord Tissue Drug Screen Results and Make Report if Warranted, [place], Perinatal Mood and Anxiety Disorder (PMADs) Education, No Further Intervention Required/No Barriers to Discharge  ? ? ?Lia Hopping, LCSW ?04/17/2022, 4:10 PM ? ?

## 2022-04-18 ENCOUNTER — Other Ambulatory Visit (HOSPITAL_COMMUNITY): Payer: Self-pay

## 2022-04-18 MED ORDER — IBUPROFEN 600 MG PO TABS
600.0000 mg | ORAL_TABLET | Freq: Four times a day (QID) | ORAL | 0 refills | Status: AC | PRN
Start: 2022-04-18 — End: ?
  Filled 2022-04-18: qty 60, 15d supply, fill #0

## 2022-04-18 MED ORDER — SIMETHICONE 80 MG PO CHEW
80.0000 mg | CHEWABLE_TABLET | ORAL | 0 refills | Status: AC | PRN
Start: 2022-04-18 — End: ?
  Filled 2022-04-18: qty 30, 15d supply, fill #0

## 2022-04-18 MED ORDER — POLYETHYLENE GLYCOL 3350 17 GM/SCOOP PO POWD
17.0000 g | Freq: Every day | ORAL | 0 refills | Status: AC
  Filled 2022-04-18: qty 238, 14d supply, fill #0

## 2022-04-18 MED ORDER — FUROSEMIDE 20 MG PO TABS
20.0000 mg | ORAL_TABLET | Freq: Every day | ORAL | 0 refills | Status: AC
Start: 2022-04-18 — End: ?
  Filled 2022-04-18: qty 4, 4d supply, fill #0

## 2022-04-22 ENCOUNTER — Telehealth: Payer: Self-pay

## 2022-04-22 NOTE — Telephone Encounter (Signed)
Disability and Leave paperwork completed and placed at front desk for pick up. ? ?Left message informing patient on vm that paperwork was ready for pick up. ?

## 2022-04-23 ENCOUNTER — Ambulatory Visit: Payer: No Typology Code available for payment source

## 2022-04-24 ENCOUNTER — Encounter: Payer: No Typology Code available for payment source | Admitting: Obstetrics and Gynecology

## 2022-04-25 ENCOUNTER — Telehealth (HOSPITAL_COMMUNITY): Payer: Self-pay | Admitting: *Deleted

## 2022-04-25 NOTE — Telephone Encounter (Signed)
Mom reports feeling good. No concerns about herself at this time. EPDS=2 [facility] score=4) ?Mom reports baby is doing well. Feeding, peeing, and pooping without difficulty. Safe sleep reviewed. Mom reports no concerns about baby at present. ? ?Duffy Rhody, RN 04-25-2022 at 1:08pm ?

## 2022-04-29 ENCOUNTER — Ambulatory Visit (INDEPENDENT_AMBULATORY_CARE_PROVIDER_SITE_OTHER): Payer: No Typology Code available for payment source

## 2022-04-29 VITALS — BP 121/83 | HR 88 | Ht 63.0 in | Wt 194.0 lb

## 2022-04-29 DIAGNOSIS — Z0130 Encounter for examination of blood pressure without abnormal findings: Secondary | ICD-10-CM

## 2022-04-29 NOTE — Progress Notes (Signed)
Subjective:  [name] is a 41 y.o. female 2 Weeks PP, here for BP check.  ? ?Hypertension ROS: taking medications as instructed, no medication side effects noted, no TIA's, no chest pain on exertion, no dyspnea on exertion, no swelling of ankles, no orthostatic dizziness or lightheadedness, and no palpitations.  ? ? ?Objective:  ?BP 121/83 (BP Location: Left Arm, Cuff Size: Large)   Pulse 88   Ht 5\' 3"  (1.6 m)   Wt 194 lb (88 kg)   LMP 07/29/2021   Breastfeeding No   BMI 34.37 kg/m?   ?Appearance alert, well appearing, and in no distress. ?General exam BP noted to be well controlled today in office.  ? ? ?Assessment:   ?Blood Pressure well controlled.  ? ?Plan:  ?Per APP, Current treatment plan is effective, no change in therapy. Keep PP visit appt.  ?

## 2022-04-29 NOTE — Progress Notes (Signed)
I have reviewed the chart and agree with the nurse's note and plan of care for this visit.  ? ?Fatima Blank, CNM ?12:56 PM ? ?

## 2022-05-01 ENCOUNTER — Encounter: Payer: No Typology Code available for payment source | Admitting: Obstetrics and Gynecology

## 2022-05-28 ENCOUNTER — Ambulatory Visit (INDEPENDENT_AMBULATORY_CARE_PROVIDER_SITE_OTHER): Payer: No Typology Code available for payment source | Admitting: Medical

## 2022-05-28 ENCOUNTER — Encounter: Payer: Self-pay | Admitting: Medical

## 2022-05-28 ENCOUNTER — Other Ambulatory Visit: Payer: No Typology Code available for payment source

## 2022-05-28 DIAGNOSIS — Z8632 Personal history of gestational diabetes: Secondary | ICD-10-CM | POA: Diagnosis not present

## 2022-05-28 DIAGNOSIS — Z8759 Personal history of other complications of pregnancy, childbirth and the puerperium: Secondary | ICD-10-CM | POA: Diagnosis not present

## 2022-05-28 DIAGNOSIS — Z30013 Encounter for initial prescription of injectable contraceptive: Secondary | ICD-10-CM

## 2022-05-28 DIAGNOSIS — Z3009 Encounter for other general counseling and advice on contraception: Secondary | ICD-10-CM | POA: Diagnosis not present

## 2022-05-28 LAB — POCT URINE PREGNANCY: Preg Test, Ur: NEGATIVE

## 2022-05-28 MED ORDER — MEDROXYPROGESTERONE ACETATE 150 MG/ML IM SUSP: 150.0000 mg | INTRAMUSCULAR | 0 refills | Status: AC

## 2022-05-28 MED ORDER — MEDROXYPROGESTERONE ACETATE 150 MG/ML IM SUSP
150.0000 mg | Freq: Once | INTRAMUSCULAR | Status: AC
  Administered 2022-05-28: 150 mg via INTRAMUSCULAR

## 2022-05-28 NOTE — Progress Notes (Signed)
Cassandra Roth Visit Note  Cassandra Roth is a 41 y.o. G37P3003 female who presents for a postpartum visit. She is 6 week postpartum following a normal spontaneous vaginal delivery.  I have fully reviewed the prenatal and intrapartum course. The delivery was at 3 gestational weeks.  Anesthesia: epidural. Postpartum course has been unremarkable. Baby is doing well. Baby is feeding by bottle - Cassandra Roth . Bleeding changing a maxi pad every 2 hours. Bowel function is normal. Bladder function is normal. Patient is sexually active. Contraception method is condoms. Postpartum depression screening: negative.   Upstream - 05/28/22 0947       Pregnancy Intention Screening   Does the patient want to become pregnant in the next year? No    Does the patient's partner want to become pregnant in the next year? No    Would the patient like to discuss contraceptive options today? Yes      Contraception Wrap Up   Current Method Female Condom    End Method Hormonal Injection    Contraception Counseling Provided Yes            The pregnancy intention screening data noted above was reviewed. Potential methods of contraception were discussed. The patient elected to proceed with Hormonal Injection.   Edinburgh Postnatal Depression Scale - 05/28/22 0947       Edinburgh Postnatal Depression Scale:  In the Past 7 Days   I have been able to laugh and see the funny side of things. 0    I have looked forward with enjoyment to things. 0    I have blamed myself unnecessarily when things went wrong. 0    I have been anxious or worried for no good reason. 0    I have felt scared or panicky for no good reason. 0    Things have been getting on top of me. 0    I have been so unhappy that I have had difficulty sleeping. 0    I have felt sad or miserable. 0    I have been so unhappy that I have been crying. 0    The thought of harming myself has occurred to me. 0    Edinburgh Postnatal Depression Scale Total  0             Health Maintenance Due  Topic Date Due   HEMOGLOBIN A1C  Never done   COVID-19 Vaccine (1) Never done   FOOT EXAM  Never done   OPHTHALMOLOGY EXAM  Never done   URINE MICROALBUMIN  Never done   TETANUS/TDAP  Never done   PAP SMEAR-Modifier  Never done    The following portions of the patient's history were reviewed and updated as appropriate: allergies, current medications, past family history, past medical history, past social history, past surgical history, and problem list.  Review of Systems Pertinent items are noted in HPI.  Objective:  BP 119/82   Pulse 67   Ht 5\' 3"  (1.6 m)   Wt 197 lb (89.4 kg)   LMP 07/29/2021   BMI 34.90 kg/m    General:  alert and cooperative   Breasts:  not indicated  Lungs: clear to auscultation bilaterally  Heart:  regular rate and rhythm, S1, S2 normal, no murmur, click, rub or gallop  Abdomen: soft, non-tender; bowel sounds normal; no masses,  no organomegaly   Wound N/A  GU exam:  not indicated       Assessment:   Normal postpartum  exam.  History of Pre-eclampsia  History of GDM  Unwanted fertility   Plan:   Essential components of care per ACOG recommendations:  1.  Mood and well being: Patient with negative depression screening today. Reviewed local resources for support.  - Patient tobacco use? Yes. Patient desires to quit? No.   - hx of drug use? No.    2. Infant care and feeding:  -Patient currently breastmilk feeding? No.  -Social determinants of health (SDOH) reviewed in EPIC. No concerns  3. Sexuality, contraception and birth spacing - Patient does not want a pregnancy in the next year.  Desired family size is 3 children.  - Reviewed reproductive life planning. Reviewed contraceptive methods based on pt preferences and effectiveness.  Patient desired Hormonal Injection today.   - Partner is getting vasectomy 06/23/22  4. Sleep and fatigue -Encouraged family/partner/community support of 4 hrs of  uninterrupted sleep to help with mood and fatigue  5. Physical Recovery  - Discussed patients delivery and complications. She describes her labor as mixed. - Patient had a Vaginal, no problems at delivery. Patient had a 1st degree laceration. Perineal healing reviewed. Patient expressed understanding - Patient has urinary incontinence? Yes. Discussed role of pelvic floor PT. - Patient is safe to resume physical and sexual activity  6.  Health Maintenance - HM due items addressed Yes - Last pap smear No results found for: DIAGPAP Pap smear not done at today's visit. Last pap in [place] was 11/29/2018. Next pap due 11/2023 -Breast Cancer screening indicated? No.   7. Chronic Disease/Pregnancy Condition follow up: Hypertension and Gestational Diabetes - Will schedule for 2 hour PP GTT, patient ate today  - PCP follow up  Kerry Hough, [place] for [facility], [place]

## 2022-08-20 ENCOUNTER — Ambulatory Visit (INDEPENDENT_AMBULATORY_CARE_PROVIDER_SITE_OTHER): Payer: No Typology Code available for payment source | Admitting: Emergency Medicine

## 2022-08-20 VITALS — BP 136/69 | Ht 62.0 in | Wt 199.7 lb

## 2022-08-20 DIAGNOSIS — Z3042 Encounter for surveillance of injectable contraceptive: Secondary | ICD-10-CM

## 2022-08-20 MED ORDER — MEDROXYPROGESTERONE ACETATE 150 MG/ML IM SUSP
150.0000 mg | Freq: Once | INTRAMUSCULAR | Status: AC
  Administered 2022-08-20: 150 mg via INTRAMUSCULAR

## 2022-08-20 NOTE — Progress Notes (Signed)
Date last pap: 2020. Last Depo-Provera: 05/28/2022. Side Effects if any: NA. Serum HCG indicated? NA. Depo-Provera 150 mg IM given by: Leavy Cella, RN into LEFT deltoid, tolerated well. Next appointment due Nov15- [facility].

## 2022-10-30 ENCOUNTER — Other Ambulatory Visit (HOSPITAL_COMMUNITY): Payer: Self-pay

## 2022-11-11 ENCOUNTER — Ambulatory Visit: Payer: No Typology Code available for payment source

## 2023-02-16 IMAGING — DX DG CHEST 1V PORT
1 series · 1 of 1 positions shown · non-contrast
Comparison: Chest x-ray 07/21/2012.

CLINICAL DATA: Cough.

EXAM:
PORTABLE CHEST 1 VIEW

[chest ap]
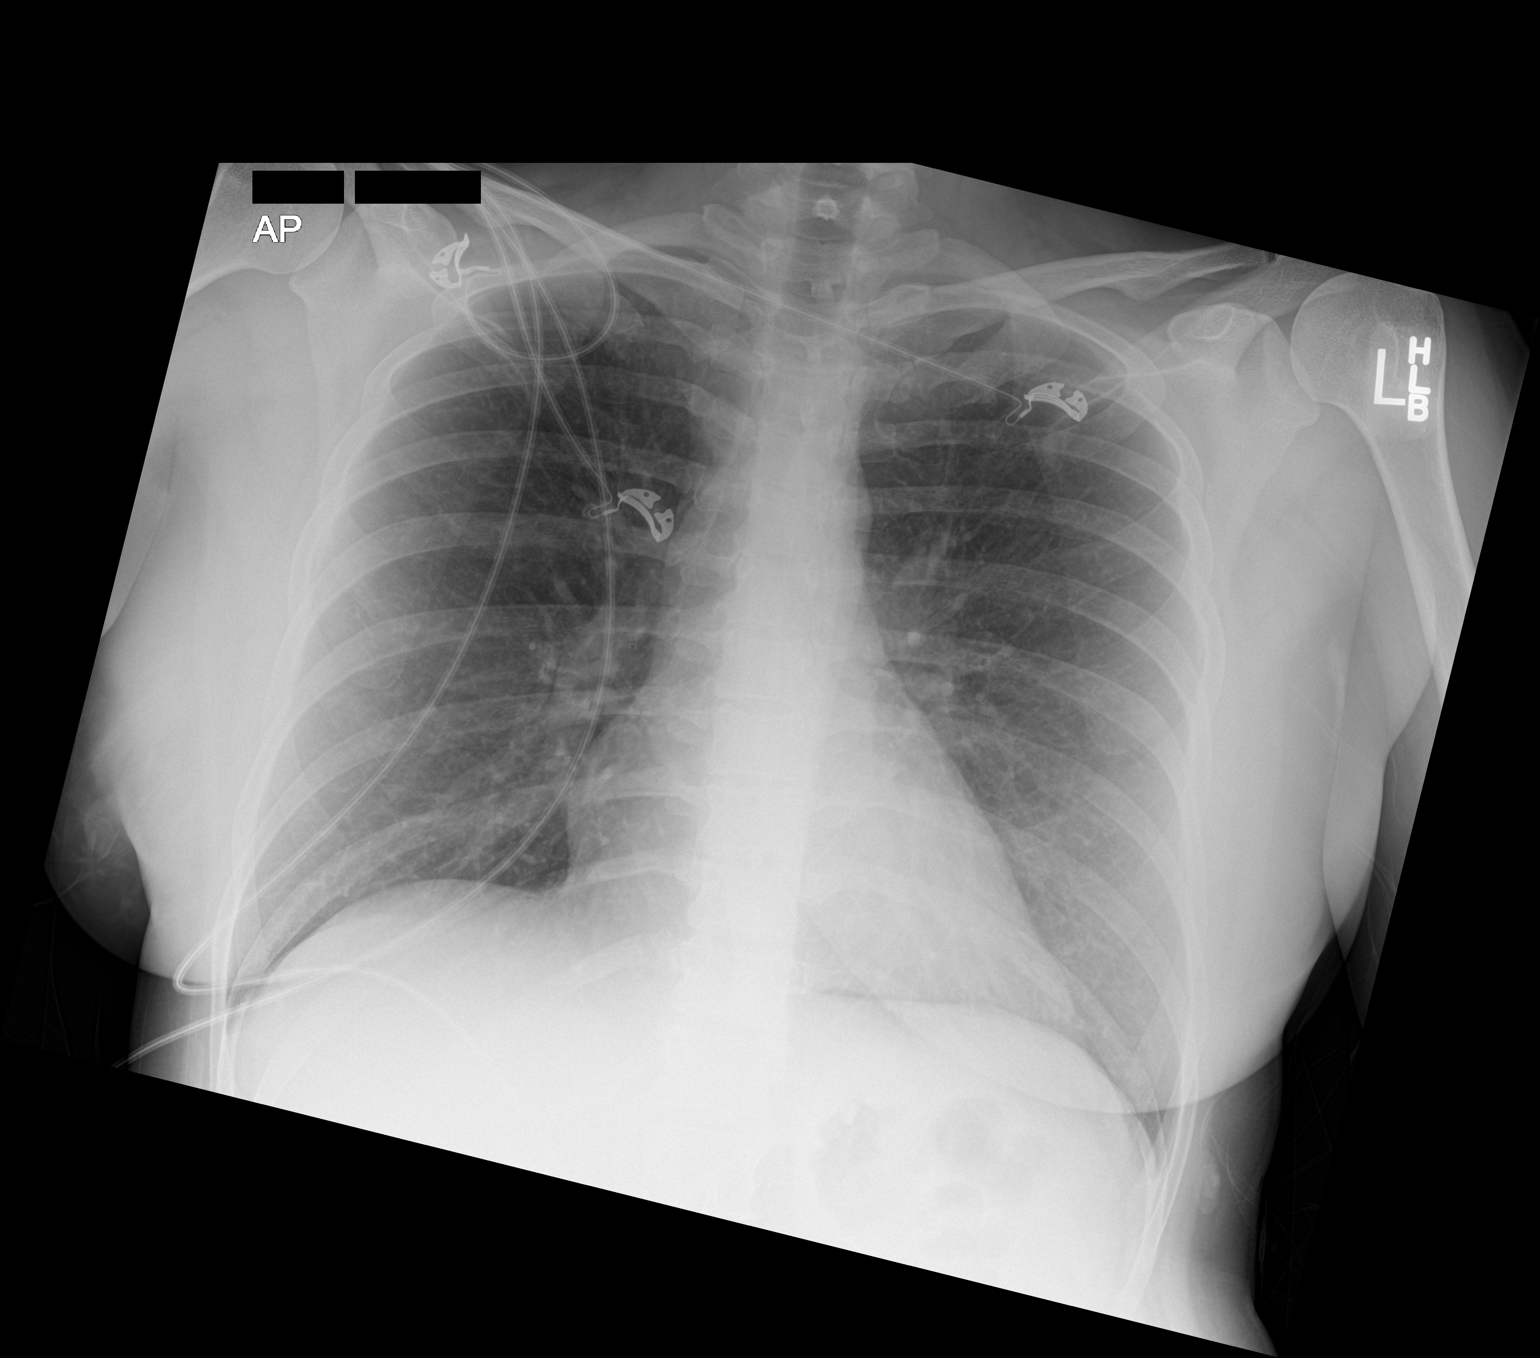

[1 of 1 positions shown; findings below may reference images not displayed]

FINDINGS: The heart size and mediastinal contours are within normal limits.
Both lungs are clear. The visualized skeletal structures are
unremarkable.
IMPRESSION: No active disease.

## 2023-08-21 ENCOUNTER — Other Ambulatory Visit (HOSPITAL_COMMUNITY): Payer: Self-pay

## 2024-12-01 ENCOUNTER — Emergency Department (HOSPITAL_BASED_OUTPATIENT_CLINIC_OR_DEPARTMENT_OTHER): Payer: PRIVATE HEALTH INSURANCE

## 2024-12-01 ENCOUNTER — Encounter (HOSPITAL_BASED_OUTPATIENT_CLINIC_OR_DEPARTMENT_OTHER): Payer: Self-pay | Admitting: Emergency Medicine

## 2024-12-01 ENCOUNTER — Other Ambulatory Visit: Payer: Self-pay

## 2024-12-01 ENCOUNTER — Emergency Department (HOSPITAL_BASED_OUTPATIENT_CLINIC_OR_DEPARTMENT_OTHER)
Admission: EM | Admit: 2024-12-01 | Discharge: 2024-12-01 | Disposition: A | Discharge status: Home / Self Care | Payer: PRIVATE HEALTH INSURANCE | Attending: Emergency Medicine | Admitting: Emergency Medicine

## 2024-12-01 DIAGNOSIS — D72829 Elevated white blood cell count, unspecified: Secondary | ICD-10-CM | POA: Insufficient documentation

## 2024-12-01 DIAGNOSIS — I10 Essential (primary) hypertension: Secondary | ICD-10-CM | POA: Diagnosis not present

## 2024-12-01 DIAGNOSIS — G4483 Primary cough headache: Secondary | ICD-10-CM | POA: Diagnosis not present

## 2024-12-01 DIAGNOSIS — R519 Headache, unspecified: Secondary | ICD-10-CM | POA: Diagnosis present

## 2024-12-01 LAB — CBC WITH DIFFERENTIAL/PLATELET
Abs Immature Granulocytes: 0.03 K/uL (ref 0.00–0.07)
Basophils Absolute: 0.1 K/uL (ref 0.0–0.1)
Basophils Relative: 0 %
Eosinophils Absolute: 0.3 K/uL (ref 0.0–0.5)
Eosinophils Relative: 2 %
HCT: 43 % (ref 36.0–46.0)
Hemoglobin: 15 g/dL (ref 12.0–15.0)
Immature Granulocytes: 0 %
Lymphocytes Relative: 30 %
Lymphs Abs: 3.4 K/uL (ref 0.7–4.0)
MCH: 32 pg (ref 26.0–34.0)
MCHC: 34.9 g/dL (ref 30.0–36.0)
MCV: 91.7 fL (ref 80.0–100.0)
Monocytes Absolute: 0.8 K/uL (ref 0.1–1.0)
Monocytes Relative: 7 %
Neutro Abs: 6.9 K/uL (ref 1.7–7.7)
Neutrophils Relative %: 61 %
Platelets: 361 K/uL (ref 150–400)
RBC: 4.69 MIL/uL (ref 3.87–5.11)
RDW: 13 % (ref 11.5–15.5)
WBC: 11.4 K/uL — ABNORMAL HIGH (ref 4.0–10.5)
nRBC: 0 % (ref 0.0–0.2)

## 2024-12-01 LAB — BASIC METABOLIC PANEL WITH GFR
Anion gap: 9 (ref 5–15)
BUN: 7 mg/dL (ref 6–20)
CO2: 29 mmol/L (ref 22–32)
Calcium: 9.5 mg/dL (ref 8.9–10.3)
Chloride: 103 mmol/L (ref 98–111)
Creatinine, Ser: 0.62 mg/dL (ref 0.44–1.00)
GFR, Estimated: >60 mL/min (ref >60)
Glucose, Bld: 91 mg/dL (ref 70–99)
Potassium: 4.2 mmol/L (ref 3.5–5.1)
Sodium: 140 mmol/L (ref 135–145)

## 2024-12-01 LAB — HCG, SERUM, QUALITATIVE: Preg, Serum: NEGATIVE

## 2024-12-01 MED ORDER — IOHEXOL 350 MG/ML SOLN
75.0000 mL | Freq: Once | INTRAVENOUS | Status: AC | PRN
  Administered 2024-12-01: 75 mL via INTRAVENOUS

## 2024-12-01 MED ORDER — FAMOTIDINE 20 MG PO TABS: 20.0000 mg | ORAL_TABLET | Freq: Two times a day (BID) | ORAL | 0 refills | Status: AC

## 2024-12-01 MED ORDER — KETOROLAC TROMETHAMINE 15 MG/ML IJ SOLN
15.0000 mg | Freq: Once | INTRAMUSCULAR | Status: AC
  Administered 2024-12-01: 15 mg via INTRAVENOUS
  Filled 2024-12-01: qty 1

## 2024-12-01 MED ORDER — INDOMETHACIN 25 MG PO CAPS: 25.0000 mg | ORAL_CAPSULE | Freq: Three times a day (TID) | ORAL | 0 refills | Status: DC | PRN

## 2024-12-01 MED ORDER — INDOMETHACIN 25 MG PO CAPS: 25.0000 mg | ORAL_CAPSULE | Freq: Three times a day (TID) | ORAL | 0 refills | Status: AC | PRN

## 2024-12-01 NOTE — ED Triage Notes (Signed)
 Pt presents reporting headache x 3-4 weeks.  Reports they began happening only in the evenings.  Feels pressure increased with bending down.  Sometimes the headache lasts only a minute but then other times lasts 30 min severe and then subsides but makes her feel weak afterwards.  No nausea or vomiting.  No light sensitivity.  Onset with straining or bending over  Central forehead in nature.

## 2024-12-01 NOTE — ED Provider Notes (Signed)
 [place] [facility] Provider Note   CSN: [id number] Arrival date & time: 12/01/24  1732     Patient presents with: Headache   Cassandra Roth is a 43 y.o. female.   Patient with history of gestational diabetes, hypertension --presents to the emergency department today for evaluation of new onset of headache.  Patient reports headaches in the evenings starting about 3 weeks ago.  These occurred when she would bend over or bear down.  They would be very brief lasting for about a minute or so, sometimes afterwards she would have a soreness in her head that was more mild and she would feel wiped out.  Over time symptoms have progressed to occurring all of the time.  When she bends over to put on her shoes, when she tries to pick up her children, when she bears down to have a bowel movement --she reports severe pain in the frontal part of her head that brings her to tears.  She denies vision change or vision loss.  Patient denies signs of stroke including: facial droop, slurred speech, aphasia, weakness/numbness in extremities, imbalance/trouble walking.  She has not had headaches like this in the past.  She did not hit her head or have any injuries preceding her symptoms.  She was concerned because her partner's mother had an aneurysm in the past, but patient does not have any family history of brain aneurysms.        Prior to Admission medications  Medication Sig Start Date End Date Taking? Authorizing Provider  Accu-Chek Softclix Lancets lancets 1 each by Other route 4 (four) times daily. Use as instructed 02/17/22   Constant, Peggy, MD  Accu-Chek Softclix Lancets lancets 100 each by Other route 4 (four) times daily. 02/18/22   Eveline Lynwood MATSU, MD  acetaminophen  (TYLENOL ) 325 MG tablet Take 2 tablets (650 mg total) by mouth every 4 (four) hours as needed (for pain scale < 4  OR  temperature  >/=  100.5 F). 02/01/22   Ozan, Jennifer, DO  Blood Glucose  Monitoring Suppl (ACCU-CHEK GUIDE ME) w/Device KIT 1 kit by Does not apply route daily. 02/18/22   Eveline Lynwood MATSU, MD  Blood Glucose Monitoring Suppl (CONTOUR NEXT MONITOR) w/Device KIT 1 kit by Does not apply route as needed. 02/17/22   Constant, Peggy, MD  Blood Pressure Monitoring (BLOOD PRESSURE KIT) DEVI 1 kit by Does not apply route once a week. 11/18/21   Constant, Peggy, MD  furosemide  (LASIX ) 20 MG tablet Take 1 tablet (20 mg total) by mouth daily. Patient not taking: Reported on 04/29/2022 04/18/22   Jarrett Lucie LOISE, DO  glucose blood (ACCU-CHEK GUIDE) test strip Use 4 times daily. Please check blood sugar first thing in the morning before breakfast fasting and 2 hours after every meal. 02/18/22   Eveline Lynwood MATSU, MD  ibuprofen  (ADVIL ) 600 MG tablet Take 1 tablet (600 mg total) by mouth every 6 (six) hours as needed. 04/18/22   Jarrett Lucie LOISE, DO  medroxyPROGESTERone  (DEPO-PROVERA ) 150 MG/ML injection Inject 1 mL (150 mg total) into the muscle every 3 (three) months. 05/28/22   Larwence Mliss LOISE, PA-C  Misc. Devices (GOJJI WEIGHT SCALE) MISC 1 Device by Does not apply route every 30 (thirty) days. Patient not taking: Reported on 03/25/2022 11/18/21   Constant, Peggy, MD  polyethylene glycol powder (GLYCOLAX /MIRALAX ) 17 GM/SCOOP powder Take 17 g by mouth daily. 04/18/22   Jarrett Lucie LOISE, DO  Prenatal Vit-Fe Fumarate-FA (PRENATAL  PLUS VITAMIN/MINERAL) 27-1 MG TABS Take 1 tablet by mouth daily. 10/24/21   [provider]  simethicone  (MYLICON) 80 MG chewable tablet Chew 1 tablet (80 mg total) by mouth as needed for flatulence. 04/18/22   Jarrett Lucie SAILOR, DO    Allergies: Zoloft [sertraline hcl]    Review of Systems  Updated Vital Signs BP (!) 166/97 (BP Location: Right Arm)   Pulse 75   Temp 98.7 F (37.1 C) (Oral)   Resp 16   SpO2 100%   Physical Exam Vitals and nursing note reviewed.  Constitutional:      Appearance: She is well-developed.  HENT:     Head: Normocephalic  and atraumatic.     Right Ear: Tympanic membrane, ear canal and external ear normal.     Left Ear: Tympanic membrane, ear canal and external ear normal.     Nose: Nose normal.     Mouth/Throat:     Pharynx: Uvula midline.  Eyes:     General: Lids are normal.     Extraocular Movements:     Right eye: No nystagmus.     Left eye: No nystagmus.     Conjunctiva/sclera: Conjunctivae normal.     Pupils: Pupils are equal, round, and reactive to light.  Cardiovascular:     Rate and Rhythm: Normal rate and regular rhythm.  Pulmonary:     Effort: Pulmonary effort is normal.     Breath sounds: Normal breath sounds.  Abdominal:     Palpations: Abdomen is soft.     Tenderness: There is no abdominal tenderness.  Musculoskeletal:     Cervical back: Normal range of motion and neck supple. No tenderness or bony tenderness.  Skin:    General: Skin is warm and dry.  Neurological:     Mental Status: She is alert and oriented to person, place, and time.     GCS: GCS eye subscore is 4. GCS verbal subscore is 5. GCS motor subscore is 6.     Cranial Nerves: No cranial nerve deficit.     Sensory: No sensory deficit.     Motor: No weakness.     Coordination: Coordination normal.     Gait: Gait normal.     Comments: Upper extremity myotomes tested bilaterally:  C5 Shoulder abduction 5/5 C6 Elbow flexion/wrist extension 5/5 C7 Elbow extension 5/5 C8 Finger flexion 5/5 T1 Finger abduction 5/5  Lower extremity myotomes tested bilaterally: L2 Hip flexion 5/5 L3 Knee extension 5/5 L4 Ankle dorsiflexion 5/5 S1 Ankle plantar flexion 5/5      (all labs ordered are listed, but only abnormal results are displayed) Labs Reviewed  CBC WITH DIFFERENTIAL/PLATELET - Abnormal; Notable for the following components:      Result Value   WBC 11.4 (*)    All other components within normal limits  BASIC METABOLIC PANEL WITH GFR  HCG, SERUM, QUALITATIVE   ED Course  Patient seen and examined. History  obtained directly from patient.   Labs/EKG: Ordered CBC, BMP, pregnancy.  Imaging: Ordered CT angio of the head with and without contrast  Medications/Fluids: None ordered  Most recent vital signs reviewed and are as follows: BP (!) 166/97 (BP Location: Right Arm)   Pulse 75   Temp 98.7 F (37.1 C) (Oral)   Resp 16   SpO2 100%   Initial impression: Positional headache with Valsalva, bending over, lifting --progressing.  Normal neurologic exam.  No vision changes.  This is patient's initial evaluation for this complaint.  9:16  PM Reassessment performed. Patient appears stable.  She was given a dose of IV Toradol  after imaging resulted.  Labs personally reviewed and interpreted including: CBC with elevated white blood cell count at 11.4, hemoglobin 15; BMP unremarkable.  Imaging personally visualized and interpreted including: CT angio head and neck was reassuring, essentially negative.  Reviewed pertinent lab work and imaging with patient at bedside. Questions answered.   Most current vital signs reviewed and are as follows: BP 112/81   Pulse 71   Temp 98.7 F (37.1 C) (Oral)   Resp 16   SpO2 100%   Plan: Discharge to home.  Symptoms seem relatively consistent with cough headache.  Reviewed recommendations in up-to-date and I feel that it would be reasonable to trial indomethacin until she can follow-up with neurology.  I have also prescribed Pepcid for GI protection.  Prescriptions written for: Indomethacin  Other home care instructions discussed: Avoidance of triggers  ED return instructions discussed: Patient counseled to return if they have weakness in their arms or legs, slurred speech, trouble walking or talking, confusion, trouble with their balance, or if they have any other concerns. Patient verbalizes understanding and agrees with plan.   Follow-up instructions discussed: Patient encouraged to follow-up with neurology referral.   EKG: None  Radiology: CT  ANGIO HEAD NECK W WO CM Result Date: 12/01/2024 EXAM: CTA Head and Neck with Intravenous Contrast. CT Head without Contrast. CLINICAL HISTORY: Headache with valsalva, positional changes, progressive over 2 weeks. TECHNIQUE: Axial CTA images of the head and neck performed with intravenous contrast. MIP reconstructed images were created and reviewed. Axial computed tomography images of the head/brain performed without intravenous contrast. Note: Per PQRS, the description of internal carotid artery percent stenosis, including 0 percent or normal exam, is based on [employer] Symptomatic Carotid Endarterectomy Trial (NASCET) criteria. Dose reduction technique was used including one or more of the following: automated exposure control, adjustment of mA and kV according to patient size, and/or iterative reconstruction. CONTRAST: 75 mL Omnipaque  COMPARISON: CT Head July 21, 2012 FINDINGS: CT head limited by beam hardening artifact.  Within this limitation: CT HEAD: BRAIN: No acute intraparenchymal hemorrhage. No mass lesion. No CT evidence for acute territorial infarct. No midline shift or extra-axial collection. VENTRICLES: No hydrocephalus. ORBITS: The orbits are unremarkable. SINUSES AND MASTOIDS: The paranasal sinuses and mastoid air cells are clear. CTA NECK: COMMON CAROTID ARTERIES: No significant stenosis. No dissection or occlusion. INTERNAL CAROTID ARTERIES: No stenosis by NASCET criteria. No dissection or occlusion. VERTEBRAL ARTERIES: No significant stenosis. No dissection or occlusion. CTA HEAD: ANTERIOR CEREBRAL ARTERIES: No significant stenosis. No occlusion. No aneurysm. MIDDLE CEREBRAL ARTERIES: No significant stenosis. No occlusion. No aneurysm. POSTERIOR CEREBRAL ARTERIES: No significant stenosis. No occlusion. No aneurysm.  Right fetal type PCA. BASILAR ARTERY: No significant stenosis. No occlusion. No aneurysm. OTHER: SOFT TISSUES: No acute finding. No masses or lymphadenopathy. BONES: No acute  osseous abnormality. LUNG APICES: Emphysema. IMPRESSION: 1. No large vessel occlusion or significant stenosis. 2. No evidence of obvious acute intracranial abnormality on limited CT head. Electronically signed by: Gilmore Molt 12/01/2024 08:34 PM EST RP Workstation: [id number]     Procedures   Medications Ordered in the ED - No data to display                                  Medical Decision Making Amount and/or Complexity of Data Reviewed Labs: ordered. Radiology: ordered.  Risk Prescription drug management.   In regards to the patient's headache, critical differentials were considered including subarachnoid hemorrhage, intracerebral hemorrhage, epidural/subdural hematoma, pituitary apoplexy, vertebral/carotid artery dissection, giant cell arteritis, central venous thrombosis, reversible cerebral vasoconstriction, acute angle closure glaucoma, idiopathic intracranial hypertension, bacterial meningitis, viral encephalitis, carbon monoxide poisoning, posterior reversible encephalopathy syndrome, pre-eclampsia.   Reg flag symptoms related to these causes were considered including systemic symptoms (fever, weight loss), neurologic symptoms (confusion, mental status change, vision change, associated seizure), acute or sudden thunderclap onset, patient age 32 or older with new or progressive headache, patient of any age with first headache or change in headache pattern, pregnant or postpartum status, history of HIV or other immunocompromise, history of cancer, headache occurring with exertion, associated neck or shoulder pain, associated traumatic injury, concurrent use of anticoagulation, family history of spontaneous SAH, and concurrent drug use.    Other benign, more common causes of headache were considered including migraine, tension-type headache, cluster headache, referred pain from other cause such as sinus infection, dental pain, trigeminal neuralgia.   On exam, patient has a  reassuring neuro exam including baseline mental status, no significant neck pain or meningeal signs, no signs of severe infection or fever.   CTA of the head and neck was reassuring.  Working diagnosis of cough headache.  Will trial indomethacin until neurology follow-up.  The patient's vital signs, pertinent lab work and imaging were reviewed and interpreted as discussed in the ED course. Hospitalization was considered for further testing, treatments, or serial exams/observation. However as patient is well-appearing, has a stable exam over the course of their evaluation, and reassuring studies today, I do not feel that they warrant admission at this time. This plan was discussed with the patient who verbalizes agreement and comfort with this plan and seems reliable and able to return to the Emergency Department with worsening or changing symptoms.       Final diagnoses:  Cough headache    ED Discharge Orders          Ordered    indomethacin (INDOCIN) 25 MG capsule  3 times daily PRN,   Status:  Discontinued        12/01/24 2111    indomethacin (INDOCIN) 25 MG capsule  3 times daily PRN        12/01/24 2112    Ambulatory referral to Neurology       Comments: An appointment is requested in approximately: 1 week for progressive severe HA with valsalva, negative imaging in ED   12/01/24 2112    famotidine (PEPCID) 20 MG tablet  2 times daily        12/01/24 2115               Desiderio Chew, PA-C 12/01/24 2118    Long, Santia Labate G, MD 12/02/24 [phone]

## 2024-12-01 NOTE — Discharge Instructions (Addendum)
 Please read and follow all provided instructions.  Your diagnoses today include:  1. Cough headache     Tests performed today include: CT of your head and neck which did not show any signs of aneurysm, dissection, stroke, mass or other acute problems in the head or neck Vital signs. See below for your results today.   Medications:  Indomethacin: which is an NSAID type of medication to take 3 times a day.  Do not take this with ibuprofen , naproxen , or aspirin .  Pepcid (famotidine): medication to help prevent stomach irritation from NSAID medications  You can find this medication over-the-counter.   DO NOT exceed:  20mg  Pepcid every 12 hours  Take any prescribed medications only as directed.  Additional information:  Follow any educational materials contained in this packet.  You are having a headache. No specific cause was found today for your headache. It may have been a migraine or other cause of headache. Stress, anxiety, fatigue, and depression are common triggers for headaches.   Your headache today does not appear to be life-threatening or require hospitalization, but often the exact cause of headaches is not determined in the emergency department. Therefore, follow-up with your doctor is very important to find out what may have caused your headache and whether or not you need any further diagnostic testing or treatment.   Sometimes headaches can appear benign (not harmful), but then more serious symptoms can develop which should prompt an immediate re-evaluation by your doctor or the emergency department.  BE VERY CAREFUL not to take multiple medicines containing Tylenol  (also called acetaminophen ). Doing so can lead to an overdose which can damage your liver and cause liver failure and possibly death.   Follow-up instructions: Please follow-up with neurology referral for further evaluation of your headache.  Return instructions:  Please return to the Emergency Department  if you experience worsening symptoms. Return if the medications do not resolve your headache, if it recurs, or if you have multiple episodes of vomiting or cannot keep down fluids. Return if you have a change from the usual headache. RETURN IMMEDIATELY IF you: Develop a sudden, severe headache Develop confusion or become poorly responsive or faint Develop a fever above 100.9F or problem breathing Have a change in speech, vision, swallowing, or understanding Develop new weakness, numbness, tingling, incoordination in your arms or legs Have a seizure Please return if you have any other emergent concerns.  Additional Information:  Your vital signs today were: BP 112/81   Pulse 71   Temp 98.7 F (37.1 C) (Oral)   Resp 16   SpO2 100%  If your blood pressure (BP) was elevated above 135/85 this visit, please have this repeated by your doctor within one month. --------------
